# Patient Record
Sex: Female | Born: 1960 | Race: White | Hispanic: No | Marital: Single | Smoking: Never smoker
Health system: Southern US, Community
[De-identification: ages and names within clinical notes are randomized; demographics above are authoritative.]

## PROBLEM LIST (undated history)

## (undated) DIAGNOSIS — Z8659 Personal history of other mental and behavioral disorders: Secondary | ICD-10-CM

## (undated) DIAGNOSIS — Z872 Personal history of diseases of the skin and subcutaneous tissue: Secondary | ICD-10-CM

## (undated) DIAGNOSIS — Z8639 Personal history of other endocrine, nutritional and metabolic disease: Secondary | ICD-10-CM

## (undated) HISTORY — DX: Personal history of other mental and behavioral disorders: Z86.59

## (undated) HISTORY — DX: Personal history of diseases of the skin and subcutaneous tissue: Z87.2

## (undated) HISTORY — PX: BREAST SURGERY: SHX581

## (undated) HISTORY — DX: Personal history of other endocrine, nutritional and metabolic disease: Z86.39

## (undated) HISTORY — PX: WISDOM TOOTH EXTRACTION: SHX21

---

## 1998-11-07 ENCOUNTER — Other Ambulatory Visit: Admission: RE | Admit: 1998-11-07 | Discharge: 1998-11-07 | Payer: Self-pay | Admitting: Obstetrics and Gynecology

## 1999-05-28 ENCOUNTER — Encounter: Admission: RE | Admit: 1999-05-28 | Discharge: 1999-08-26 | Payer: Self-pay | Admitting: Psychology

## 2001-03-02 ENCOUNTER — Encounter: Admission: RE | Admit: 2001-03-02 | Discharge: 2001-05-31 | Payer: Self-pay

## 2001-06-06 ENCOUNTER — Encounter: Admission: RE | Admit: 2001-06-06 | Discharge: 2001-09-04 | Payer: Self-pay

## 2001-09-06 ENCOUNTER — Encounter: Admission: RE | Admit: 2001-09-06 | Discharge: 2001-11-09 | Payer: Self-pay | Admitting: *Deleted

## 2001-10-11 ENCOUNTER — Encounter: Admission: RE | Admit: 2001-10-11 | Discharge: 2002-01-09 | Payer: Self-pay | Admitting: *Deleted

## 2001-10-13 ENCOUNTER — Other Ambulatory Visit: Admission: RE | Admit: 2001-10-13 | Discharge: 2001-10-13 | Payer: Self-pay | Admitting: Family Medicine

## 2001-11-24 ENCOUNTER — Encounter: Admission: RE | Admit: 2001-11-24 | Discharge: 2002-02-22 | Payer: Self-pay | Admitting: *Deleted

## 2002-02-27 ENCOUNTER — Encounter: Admission: RE | Admit: 2002-02-27 | Discharge: 2002-05-28 | Payer: Self-pay | Admitting: *Deleted

## 2002-12-12 ENCOUNTER — Encounter: Admission: RE | Admit: 2002-12-12 | Discharge: 2002-12-12 | Payer: Self-pay | Admitting: Family Medicine

## 2003-01-01 ENCOUNTER — Encounter: Admission: RE | Admit: 2003-01-01 | Discharge: 2003-01-01 | Payer: Self-pay | Admitting: Family Medicine

## 2003-01-15 ENCOUNTER — Encounter: Admission: RE | Admit: 2003-01-15 | Discharge: 2003-01-15 | Payer: Self-pay | Admitting: Family Medicine

## 2003-01-25 ENCOUNTER — Encounter: Admission: RE | Admit: 2003-01-25 | Discharge: 2003-01-25 | Payer: Self-pay | Admitting: Family Medicine

## 2003-02-01 ENCOUNTER — Encounter: Admission: RE | Admit: 2003-02-01 | Discharge: 2003-02-01 | Payer: Self-pay | Admitting: Family Medicine

## 2003-02-09 ENCOUNTER — Encounter: Admission: RE | Admit: 2003-02-09 | Discharge: 2003-02-09 | Payer: Self-pay | Admitting: Family Medicine

## 2003-02-16 ENCOUNTER — Encounter: Admission: RE | Admit: 2003-02-16 | Discharge: 2003-02-16 | Payer: Self-pay | Admitting: Family Medicine

## 2003-03-02 ENCOUNTER — Encounter: Admission: RE | Admit: 2003-03-02 | Discharge: 2003-03-02 | Payer: Self-pay | Admitting: Family Medicine

## 2003-03-05 ENCOUNTER — Other Ambulatory Visit: Admission: RE | Admit: 2003-03-05 | Discharge: 2003-03-05 | Payer: Self-pay | Admitting: Family Medicine

## 2003-03-09 ENCOUNTER — Encounter: Admission: RE | Admit: 2003-03-09 | Discharge: 2003-03-09 | Payer: Self-pay | Admitting: Family Medicine

## 2003-03-20 ENCOUNTER — Encounter: Admission: RE | Admit: 2003-03-20 | Discharge: 2003-03-20 | Payer: Self-pay | Admitting: Family Medicine

## 2003-04-04 ENCOUNTER — Encounter: Admission: RE | Admit: 2003-04-04 | Discharge: 2003-04-04 | Payer: Self-pay | Admitting: Family Medicine

## 2003-04-19 ENCOUNTER — Encounter: Admission: RE | Admit: 2003-04-19 | Discharge: 2003-04-19 | Payer: Self-pay | Admitting: Sports Medicine

## 2003-04-26 ENCOUNTER — Encounter: Admission: RE | Admit: 2003-04-26 | Discharge: 2003-04-26 | Payer: Self-pay | Admitting: Family Medicine

## 2003-06-07 ENCOUNTER — Encounter: Admission: RE | Admit: 2003-06-07 | Discharge: 2003-06-07 | Payer: Self-pay | Admitting: Family Medicine

## 2003-06-26 ENCOUNTER — Encounter: Admission: RE | Admit: 2003-06-26 | Discharge: 2003-06-26 | Payer: Self-pay | Admitting: Family Medicine

## 2003-07-18 ENCOUNTER — Encounter: Admission: RE | Admit: 2003-07-18 | Discharge: 2003-07-18 | Payer: Self-pay | Admitting: Family Medicine

## 2003-08-06 ENCOUNTER — Encounter: Admission: RE | Admit: 2003-08-06 | Discharge: 2003-08-06 | Payer: Self-pay | Admitting: Family Medicine

## 2003-09-20 ENCOUNTER — Encounter: Admission: RE | Admit: 2003-09-20 | Discharge: 2003-09-20 | Payer: Self-pay | Admitting: Sports Medicine

## 2003-10-22 ENCOUNTER — Ambulatory Visit: Payer: Self-pay | Admitting: Family Medicine

## 2003-11-05 ENCOUNTER — Ambulatory Visit: Payer: Self-pay | Admitting: Family Medicine

## 2003-11-19 ENCOUNTER — Ambulatory Visit: Payer: Self-pay | Admitting: Family Medicine

## 2003-12-04 ENCOUNTER — Ambulatory Visit: Payer: Self-pay | Admitting: Family Medicine

## 2003-12-18 ENCOUNTER — Ambulatory Visit: Payer: Self-pay | Admitting: Family Medicine

## 2004-01-03 ENCOUNTER — Ambulatory Visit: Payer: Self-pay | Admitting: Family Medicine

## 2004-02-04 ENCOUNTER — Ambulatory Visit: Payer: Self-pay | Admitting: Family Medicine

## 2004-02-25 ENCOUNTER — Ambulatory Visit: Payer: Self-pay | Admitting: Sports Medicine

## 2004-03-19 ENCOUNTER — Ambulatory Visit: Payer: Self-pay | Admitting: Family Medicine

## 2004-04-07 ENCOUNTER — Ambulatory Visit: Payer: Self-pay | Admitting: Sports Medicine

## 2004-04-30 ENCOUNTER — Ambulatory Visit: Payer: Self-pay | Admitting: Family Medicine

## 2004-06-03 ENCOUNTER — Ambulatory Visit: Payer: Self-pay | Admitting: Family Medicine

## 2004-06-17 ENCOUNTER — Ambulatory Visit: Payer: Self-pay | Admitting: Family Medicine

## 2004-06-24 ENCOUNTER — Ambulatory Visit: Payer: Self-pay | Admitting: Family Medicine

## 2004-07-15 ENCOUNTER — Ambulatory Visit: Payer: Self-pay | Admitting: Family Medicine

## 2004-07-30 ENCOUNTER — Ambulatory Visit: Payer: Self-pay | Admitting: Family Medicine

## 2004-08-05 ENCOUNTER — Other Ambulatory Visit: Admission: RE | Admit: 2004-08-05 | Discharge: 2004-08-05 | Payer: Self-pay | Admitting: Family Medicine

## 2004-08-08 ENCOUNTER — Encounter: Admission: RE | Admit: 2004-08-08 | Discharge: 2004-08-08 | Payer: Self-pay | Admitting: Family Medicine

## 2004-08-19 ENCOUNTER — Ambulatory Visit: Payer: Self-pay | Admitting: Sports Medicine

## 2004-09-03 ENCOUNTER — Ambulatory Visit: Payer: Self-pay | Admitting: Family Medicine

## 2004-09-10 ENCOUNTER — Ambulatory Visit: Payer: Self-pay | Admitting: Family Medicine

## 2004-09-18 ENCOUNTER — Ambulatory Visit: Payer: Self-pay | Admitting: Family Medicine

## 2004-10-30 ENCOUNTER — Ambulatory Visit: Payer: Self-pay | Admitting: Family Medicine

## 2004-11-13 ENCOUNTER — Ambulatory Visit: Payer: Self-pay | Admitting: Family Medicine

## 2004-12-08 ENCOUNTER — Ambulatory Visit: Payer: Self-pay | Admitting: Family Medicine

## 2004-12-22 ENCOUNTER — Ambulatory Visit: Payer: Self-pay | Admitting: Sports Medicine

## 2005-01-01 ENCOUNTER — Ambulatory Visit: Payer: Self-pay | Admitting: Family Medicine

## 2005-01-06 ENCOUNTER — Ambulatory Visit: Payer: Self-pay | Admitting: Family Medicine

## 2005-02-10 ENCOUNTER — Ambulatory Visit: Payer: Self-pay | Admitting: Family Medicine

## 2005-02-24 ENCOUNTER — Ambulatory Visit: Payer: Self-pay | Admitting: Family Medicine

## 2005-03-19 ENCOUNTER — Ambulatory Visit: Payer: Self-pay | Admitting: Family Medicine

## 2005-04-08 ENCOUNTER — Ambulatory Visit: Payer: Self-pay | Admitting: Family Medicine

## 2005-05-28 ENCOUNTER — Ambulatory Visit: Payer: Self-pay | Admitting: Family Medicine

## 2005-06-25 ENCOUNTER — Ambulatory Visit: Payer: Self-pay | Admitting: Family Medicine

## 2005-07-09 ENCOUNTER — Ambulatory Visit: Payer: Self-pay | Admitting: Family Medicine

## 2005-08-04 ENCOUNTER — Ambulatory Visit: Payer: Self-pay | Admitting: Family Medicine

## 2005-08-25 ENCOUNTER — Ambulatory Visit: Payer: Self-pay | Admitting: Family Medicine

## 2005-10-05 ENCOUNTER — Ambulatory Visit: Payer: Self-pay | Admitting: Family Medicine

## 2005-10-19 ENCOUNTER — Ambulatory Visit: Payer: Self-pay | Admitting: Family Medicine

## 2005-11-10 ENCOUNTER — Ambulatory Visit: Payer: Self-pay | Admitting: Family Medicine

## 2005-12-03 ENCOUNTER — Ambulatory Visit: Payer: Self-pay | Admitting: Sports Medicine

## 2006-10-08 ENCOUNTER — Telehealth: Payer: Self-pay | Admitting: Family Medicine

## 2008-02-20 ENCOUNTER — Emergency Department (HOSPITAL_COMMUNITY): Admission: EM | Admit: 2008-02-20 | Discharge: 2008-02-20 | Payer: Self-pay | Admitting: Emergency Medicine

## 2008-09-25 ENCOUNTER — Ambulatory Visit (HOSPITAL_BASED_OUTPATIENT_CLINIC_OR_DEPARTMENT_OTHER): Admission: RE | Admit: 2008-09-25 | Discharge: 2008-09-25 | Payer: Self-pay | Admitting: Orthopedic Surgery

## 2010-05-03 LAB — ANAEROBIC CULTURE: Gram Stain: NONE SEEN

## 2010-05-03 LAB — CULTURE, ROUTINE-ABSCESS: Gram Stain: NONE SEEN

## 2010-05-12 LAB — COMPREHENSIVE METABOLIC PANEL
ALT: 27 U/L (ref 0–35)
AST: 30 U/L (ref 0–37)
Albumin: 3.6 g/dL (ref 3.5–5.2)
Alkaline Phosphatase: 57 U/L (ref 39–117)
BUN: 15 mg/dL (ref 6–23)
CO2: 35 mEq/L — ABNORMAL HIGH (ref 19–32)
Calcium: 9 mg/dL (ref 8.4–10.5)
Chloride: 97 mEq/L (ref 96–112)
Creatinine, Ser: 0.82 mg/dL (ref 0.4–1.2)
GFR calc Af Amer: >60 mL/min (ref >60)
GFR calc non Af Amer: >60 mL/min (ref >60)
Glucose, Bld: 106 mg/dL — ABNORMAL HIGH (ref 70–99)
Potassium: 2.8 mEq/L — ABNORMAL LOW (ref 3.5–5.1)
Sodium: 138 mEq/L (ref 135–145)
Total Bilirubin: 0.8 mg/dL (ref 0.3–1.2)
Total Protein: 5.8 g/dL — ABNORMAL LOW (ref 6.0–8.3)

## 2010-05-12 LAB — URINE MICROSCOPIC-ADD ON

## 2010-05-12 LAB — URINALYSIS, ROUTINE W REFLEX MICROSCOPIC
Bilirubin Urine: NEGATIVE
Glucose, UA: NEGATIVE mg/dL
Hgb urine dipstick: NEGATIVE
Leukocytes, UA: NEGATIVE
Nitrite: NEGATIVE
Protein, ur: 30 mg/dL — AB
Specific Gravity, Urine: 1.025 (ref 1.005–1.030)
Urobilinogen, UA: 0.2 mg/dL (ref 0.0–1.0)
pH: 8 (ref 5.0–8.0)

## 2010-05-12 LAB — CBC
HCT: 39.4 % (ref 36.0–46.0)
Hemoglobin: 13.2 g/dL (ref 12.0–15.0)
MCHC: 33.5 g/dL (ref 30.0–36.0)
MCV: 88.3 fL (ref 78.0–100.0)
Platelets: 176 10*3/uL (ref 150–400)
RBC: 4.46 MIL/uL (ref 3.87–5.11)
RDW: 13.2 % (ref 11.5–15.5)
WBC: 3.6 10*3/uL — ABNORMAL LOW (ref 4.0–10.5)

## 2010-05-12 LAB — DIFFERENTIAL
Basophils Absolute: 0 10*3/uL (ref 0.0–0.1)
Basophils Relative: 1 % (ref 0–1)
Eosinophils Absolute: 0.1 10*3/uL (ref 0.0–0.7)
Eosinophils Relative: 2 % (ref 0–5)
Lymphocytes Relative: 30 % (ref 12–46)
Lymphs Abs: 1.1 10*3/uL (ref 0.7–4.0)
Monocytes Absolute: 0.4 10*3/uL (ref 0.1–1.0)
Monocytes Relative: 10 % (ref 3–12)
Neutro Abs: 2.1 10*3/uL (ref 1.7–7.7)
Neutrophils Relative %: 57 % (ref 43–77)

## 2010-05-12 LAB — GLUCOSE, CAPILLARY: Glucose-Capillary: 98 mg/dL (ref 70–99)

## 2010-05-21 ENCOUNTER — Other Ambulatory Visit: Payer: Self-pay | Admitting: Family Medicine

## 2010-05-21 ENCOUNTER — Other Ambulatory Visit (HOSPITAL_COMMUNITY)
Admission: RE | Admit: 2010-05-21 | Discharge: 2010-05-21 | Disposition: A | Discharge status: Home / Self Care | Payer: BC Managed Care – PPO | Source: Ambulatory Visit | Attending: Family Medicine | Admitting: Family Medicine

## 2010-05-21 DIAGNOSIS — Z124 Encounter for screening for malignant neoplasm of cervix: Secondary | ICD-10-CM | POA: Insufficient documentation

## 2010-06-10 NOTE — Consult Note (Signed)
NAMEKENNY, Ali                ACCOUNT NO.:  [id number]   MEDICAL RECORD NO.:  [id number]          PATIENT TYPE:  AMB   LOCATION:  DSC                          FACILITY:  [facility]   PHYSICIAN:  Katy Fitch. Sypher, M.D. DATE OF BIRTH:  08/21/1960   DATE OF CONSULTATION:  09/25/2008  DATE OF DISCHARGE:                                 CONSULTATION   REFERRING PHYSICIAN:  Hope M. Danella Deis, MD, dermatologist.   CHIEF COMPLAINT:  Severely swollen, discolored, and painful right ring  finger.   HISTORY OF PRESENT ILLNESS:  [name] is a 50 year old right-hand  dominant woman, who works part-time at the [employer].  She is  a very active athlete, enjoying swimming, another endeavors.  She has a  history of fungal onychomycosis affecting her toes and presumably her  fingers.   She has had a several-week syndrome of swelling and rubor of her right  ring finger followed by acute pain during the past 4 days.  She was seen  by Dr. Danella Deis for Dermatology consult earlier today, and was noted to  have profound swelling of her right ring finger nail fold with drainage  from beneath the nail.  Dr. Danella Deis made the diagnosis of a probable  stage III paronychia and recommended surgical incision and drainage.   Ms. Mudrick was advised to come urgently to the [facility] for  minor operating room procedure.  On examination, she was noted to have a  very discolored, violaceous-appearing nail fold with 3+ swelling of her  pulp of the right ring finger and marked swelling of the radial nail  wall.   This had the appearance of superinfected herpetic whitlow or possible  fungal onychomycosis with bacterial superinfection.   We recommended a proper drainage and culture, followed by therapy for  both bacterial and viral etiologies.   After informed consent, Ms. Colegrove was admitted at this time  anticipating minor operating room procedure.   Her past history is reviewed in detail.   She reported no drug or food  allergies.  She is on no routine medications.  She had been provided  prescriptions by Dr. Danella Deis including analgesic medications in the form  of Vicodin 5 mg, Diflucan for antifungal effect, and Septra DS one p.o.  b.i.d. as antibacterial antibiotic.   Her past history was detailed and otherwise noncontributory.   PHYSICAL EXAMINATION:  VITAL SIGNS:  Temperature 98.1, pulse 85 and  regular, respirations 18, blood pressure 103/75.  GENERAL:  She is awake and alert.  HEENT:  All within normal limits.  CHEST:  All within normal limits.  ABDOMEN:  All within normal limits.  NEUROLOGIC:  All within normal limits.  EXTREMITIES:  Careful inspection of her hand revealed a very swollen  right ring finger nail fold.  She had violaceous discoloration and  swelling of the pulp consistent with a possible viral whitlow versus a  chronic fungal onychomycosis with superinfection.   We advised Ms. Wellen to proceed with drainage of her nail fold by  partial removal of the nail plate and appropriate cultures.  We did discuss possible viral cultures, however, given her prolonged  course and the possibility of bacterial superinfection at this time, we  relied more on the clinical presentation of the finger and deferred  viral cultures at this time.   She was advised to proceed with simple decompression cultures and  combined viral suppression therapy in the form of acyclovir 400 mg p.o.  t.i.d. x5 days, and continuing Dr. Sherrlyn Hock DS 1 p.o. b.i.d. as  an anti-bacterial antibiotic.   Questions regarding the anticipated procedure invited and answered.      Katy Fitch Sypher, M.D.  Electronically Signed     RVS/MEDQ  D:  09/25/2008  T:  09/26/2008  Job:  [id number]   cc:   Hope M. Danella Deis, M.D.

## 2010-06-10 NOTE — Op Note (Signed)
[name], [name]                ACCOUNT NO.:  [id number]   MEDICAL RECORD NO.:  [id number]          PATIENT TYPE:  AMB   LOCATION:  DSC                          FACILITY:  [facility]   PHYSICIAN:  Valerie Fitch. Ali, M.D. DATE OF BIRTH:  October 02, 1960   DATE OF PROCEDURE:  09/25/2008  DATE OF DISCHARGE:                               OPERATIVE REPORT   PREOPERATIVE DIAGNOSES:  Possible stage III paronychia right ring finger  nail fold versus infected/superinfected herpetic whitlow.   POSTOPERATIVE DIAGNOSES:  Possible stage III paronychia right ring  finger nail fold versus infected/superinfected herpetic whitlow.   OPERATION:  Removal of radial nail plate, right ring finger followed by  drainage of dorsal and radial nail fold and drainage of pulp with  obtaining aerobic and anaerobic cultures and Gram-stain specimen  followed by dressing wound open with Xeroflo sterile gauze and Coban.   OPERATIONS:  Valerie Fitch. Sypher, MD   ASSISTANT:  Valerie Reeks Dasnoit, PA-C   ANESTHESIA:  Lidocaine 2% metacarpal head level block, right ring  finger.  This was performed in a minor operating room setting under  straight local anesthesia without sedation.   INDICATIONS:  [name] is a 50 year old right-hand dominant woman  referred through the courtesy of Dr. Campbell Ali for evaluation of what  was thought to be an acute stage III paronychia.   [name] had had swelling of the finger for nearly 2 weeks and during the  past 4 days aggressive pain, followed by purplish swelling of the nail  fold.  She saw Dr. Danella Ali for evaluation of possible nail fungus and was  noted to have what was thought to be an acute stage III paronychia.  She  was referred for an urgent hand surgery consult.   Clinical examination revealed violaceous rubor of the nail fold and  generalized swelling of the pulp.  There was no sign of proximal  lymphangitis or lymphadenopathy.   This had the appearance of a possible chronic  fungal nail infection  superinfected with bacteria or possibly a chronic herpetic whitlow  superinfected.   Due to the acute nature of the predicament, we recommended proceeding  with incision and drainage at this time and appropriate cultures.   Careful questioning of [name] revealed that she had had other  episodes of nail fold rubor and swelling that lasted up to 2 weeks and  with detailed questioning she could not specifically recall if it was  the right ring finger in the past or other digits.   She has onycholysis of her toenails.  I questioned whether or not anyone  had ever considered that she might have nail plate psoriasis.   Due to the acute nature of predicament, she is brought to the operating  room at this time for appropriate cultures and drainage.   PROCEDURE:  [name] is brought to the operating room and placed  in supine position upon operating table.   Following the placement of a 2% lidocaine metacarpal head level block,  excellent anesthesia of the right ring finger was achieved.  The right  arm was then prepped with Betadine soap and solution, sterilely draped.  The right ring finger was exsanguinated with a gauze wrap and a 1/2-inch  Penrose drain placed over the proximal phalangeal segment as digital  tourniquet.   The procedure commenced with undermining of the nail plate with a Market researcher followed by resection of a 4-mm wide strip of the radial nail  plate deep to the dorsal nail fold.   Serosanguineous fluid was recovered.  This was cultured for aerobic and  anaerobic growth and a microrongeur was used to remove inflammatory  granulation tissue to send for a Gram-stain.   The pulp was then drained by use of tenotomy scissors to separate  between the skin from the distal phalanx by release of the cutaneous  ligaments and a Therapist, nutritional was used to decompress the pulp deep to  the neurovascular bundle.  Once again serosanguineous  fluid was  recovered rather than frank purulence.   After carefully exploring this finger it was my impression that more  likely than not this was either chronic fungal onychomycosis  superinfected with bacteria or possibly a herpetic whitlow that was  superinfected.   We have advised [name] that we will dress the wound open and  recheck her in 3 days or sooner for followup.   She was provided prescription for Septra DS one p.o. b.i.d. by Dr.  Danella Ali.  We agreed that this is a very appropriate therapy to treat gram-  positive infection and some Gram negatives.  In addition, I have added  acyclovir 400 mg p.o. t.i.d. x5 days on the chance that this is herpetic  whitlow.   [name] is quite small, weighing less than 50 kg.   In addition, she was provided Vicodin 5 mg by Dr. Danella Ali and Diflucan  due to her history of fungal issues.   We will see her back for followup on August 28, 2008, at our office for  wound check.  We will examine cultures at this time.      [name], M.D.  Electronically Signed     RVS/MEDQ  D:  09/25/2008  T:  09/26/2008  Job:  [id number]   cc:   Hope M. [name], M.D.

## 2011-05-27 ENCOUNTER — Other Ambulatory Visit: Payer: Self-pay | Admitting: Dermatology

## 2011-08-06 ENCOUNTER — Ambulatory Visit (INDEPENDENT_AMBULATORY_CARE_PROVIDER_SITE_OTHER): Payer: BC Managed Care – PPO | Admitting: Sports Medicine

## 2011-08-06 VITALS — BP 90/60 | Ht 64.0 in | Wt 100.0 lb

## 2011-08-06 DIAGNOSIS — M216X9 Other acquired deformities of unspecified foot: Secondary | ICD-10-CM

## 2011-08-06 DIAGNOSIS — M722 Plantar fascial fibromatosis: Secondary | ICD-10-CM

## 2011-08-06 DIAGNOSIS — M79673 Pain in unspecified foot: Secondary | ICD-10-CM

## 2011-08-06 DIAGNOSIS — Q667 Congenital pes cavus, unspecified foot: Secondary | ICD-10-CM

## 2011-08-06 DIAGNOSIS — M79609 Pain in unspecified limb: Secondary | ICD-10-CM

## 2011-08-06 NOTE — Progress Notes (Signed)
  Subjective:    Patient ID: [name], female    DOB: 1961-01-26, 50 y.o.   MRN: [id number]  HPI Patient is a pleasant 51 year old female that comes in today with 3-4 weeks of left heel pain. No trauma that she can recall but gradual onset of pain is on the plantar aspect of her heel. Worse with running as well as with first step in the morning. She runs about 14 miles a week and also participates in [employer] as well as step aerobics. She denies pain elsewhere in her foot or ankle. No pain at rest. She's been taking over-the-counter ibuprofen which has been helpful. She's also tried several different kinds of off-the-shelf orthotics. She most recently saw Dr. Corliss Skains who ordered an x-ray of her heel. It is available for review. She denies any numbness or tingling. No similar problems in the past.   Review of Systems     Objective:   Physical Exam  Well-developed thin 51 year old female. No acute distress. Awake alert and oriented x3. Examination of the left heel shows tenderness to palpation at the calcaneal insertion of the plantar fascia. Negative calcaneal squeeze. She has a rigid pes cavus foot. No soft tissue swelling no erythema. No tenderness to palpation along the Achilles tendon. Evaluation of her running form shows overall good form for with a slight limp due to to her heel pain. Good dorsalis pedis and posterior tibial pulses. Sensation is intact to light touch grossly.   X-rays of her left heel including AP and lateral views are reviewed from an outside source. X-rays are dated 08/04/2011. Shows a small calcaneal heel at the plantar fascial insertion point. No evidence of stress fracture. No significant soft tissue swelling.  Ultrasound evaluation: Ultrasound evaluation of her left heel shows a slightly thickened plantar fascia when compared to the uninvolved right heel. There is some hypo-echogenicity suggestive of edema in the proximal plantar fascia. Ultrasound evidence of  calcaneal spurring as seen on x-ray.      Assessment & Plan:  #1. Left heel pain secondary to plantar fasciitis #2. Pes cavus foot  Home exercise program consisting of plantar fascial stretches and decentered strengthening exercises. Daily ice soaks for the left heel. Arch strap. She has a nice off-the-shelf orthotic which has a good condition heel and I've asked that she wear these in her shoes. Continue with over-the-counter anti-inflammatories when necessary and followup in 3-4 weeks for recheck. In the meantime she can continue with her activities including running as long as she is not limping. Call questions or concerns prior followup visit.

## 2011-08-27 ENCOUNTER — Ambulatory Visit: Payer: BC Managed Care – PPO | Admitting: Sports Medicine

## 2011-09-07 ENCOUNTER — Encounter: Payer: Self-pay | Admitting: Sports Medicine

## 2011-09-07 ENCOUNTER — Ambulatory Visit (INDEPENDENT_AMBULATORY_CARE_PROVIDER_SITE_OTHER): Payer: BC Managed Care – PPO | Admitting: Sports Medicine

## 2011-09-07 VITALS — BP 104/70 | HR 80 | Ht 64.0 in | Wt 100.0 lb

## 2011-09-07 DIAGNOSIS — M722 Plantar fascial fibromatosis: Secondary | ICD-10-CM

## 2011-09-07 NOTE — Progress Notes (Signed)
  Subjective:    Patient ID: [name], female    DOB: Nov 13, 1960, 51 y.o.   MRN: [id number]  HPI Estephanie is here for followup on her left heel plantar fasciitis. Overall she feels that she is about 50% better. She has been running as well as participating in high impact aerobics. Only using anti-inflammatories on an occasional basis. She's been using her well cushioned inserts. She admits that she has not been very compliant with her home exercise program.    Review of Systems     Objective:   Physical Exam Left heel shows  minimal tenderness to palpation at the calcaneal insertion of the plantar fascia. Negative calcaneal squeeze. No soft tissue swelling. Neurovascular intact distally. Walking without a limp.  MSK ultrasound of the left heel still shows a little bit of edema at the insertion of the plantar fascia but overall thickness has decreased to 18 mm.       Assessment & Plan:  1. Improving left heel pain secondary to plantar fasciitis  Patient will continue with her current treatment. I would like for her to be more diligent about doing her home exercises. Continue to increase activity as tolerated and followup when necessary.

## 2013-09-04 ENCOUNTER — Other Ambulatory Visit (HOSPITAL_COMMUNITY)
Admission: RE | Admit: 2013-09-04 | Discharge: 2013-09-04 | Disposition: A | Discharge status: Home / Self Care | Payer: BC Managed Care – PPO | Source: Ambulatory Visit | Attending: Family Medicine | Admitting: Family Medicine

## 2013-09-04 ENCOUNTER — Other Ambulatory Visit: Payer: Self-pay | Admitting: Family Medicine

## 2013-09-04 DIAGNOSIS — Z124 Encounter for screening for malignant neoplasm of cervix: Secondary | ICD-10-CM | POA: Insufficient documentation

## 2013-09-06 LAB — CYTOLOGY - PAP

## 2014-12-26 ENCOUNTER — Encounter: Payer: Self-pay | Admitting: Family Medicine

## 2014-12-26 ENCOUNTER — Ambulatory Visit (INDEPENDENT_AMBULATORY_CARE_PROVIDER_SITE_OTHER): Payer: 59 | Admitting: Family Medicine

## 2014-12-26 VITALS — BP 100/66 | Ht 64.0 in | Wt 105.0 lb

## 2014-12-26 DIAGNOSIS — M79671 Pain in right foot: Secondary | ICD-10-CM

## 2014-12-26 DIAGNOSIS — M722 Plantar fascial fibromatosis: Secondary | ICD-10-CM | POA: Insufficient documentation

## 2014-12-26 NOTE — Progress Notes (Signed)
  Valerie ParrMary W Ali - 54 y.o. female MRN [id number]  Date of birth: 1960-02-28 Valerie ParrMary W Ali is a 54 y.o. female who presents today for right heel pain.  Right heel pain, initial visit-patient with previous history of calcaneal heel spur and plantar fasciitis several years ago. She has now been having worsening right heel pain for the past 1 month. It is now point where she is limping. Pain localized in the posterior medial aspect of the calcaneus. Worse with barefoot walking or 4 steps in the morning. Pain improved with medial longitudinal arch support and tennis shoes. She has not tried stretches or ice bath at this time.  PMHx - Updated and reviewed.  Contributory factors include: Noncontributory PSHx - Updated and reviewed.  Contributory factors include:  Noncontributory FHx - Updated and reviewed.  Contributory factors include:  Noncontributory Medications - Vyvanse   ROS Per HPI.  12 point negative other than per HPI.   Exam:  Filed Vitals:   12/26/14 1524  BP: 100/66   Gen: NAD, AAO 3 Cardiorespiratory - Normal respiratory effort/rate.  RRR Skin: No rashes or erythema Extremities: No edema, pulses +2 bilateral upper and lower extremity Ankle: No visible erythema or swelling. Range of motion is full in all directions. Strength is 5/5 in all directions. Stable lateral and medial ligaments; squeeze test and kleiger test unremarkable; Talar dome nontender; No pain at base of 5th MT; No tenderness over cuboid; No tenderness over N spot or navicular prominence No tenderness on posterior aspects of lateral and medial malleolus No sign of peroneal tendon subluxations; Negative tarsal tunnel tinel's Able to walk 4 steps. R PF - TTP at medial process of calcaneal tuberosity.  Worse with dorsiflexion of great toe

## 2014-12-26 NOTE — Assessment & Plan Note (Signed)
Right foot. Ultrasound showing 0.49 cm sagittal thickness. Small hypoechoic/anechoic tear at the deep portion on the medial process of the calcaneal tuberosity. -Recommend arch straps, stretches, tennis shoes and medial longitudinal arch support. As well ice bath's and avoiding aggravating activities at this time. -Follow-up in 4-6 weeks, consider injection if still having severe pain, as well as possible Cam Walker boot. -Eventually she'll need orthotics which was discussed in today's visit.

## 2015-01-04 ENCOUNTER — Encounter: Payer: Self-pay | Admitting: Sports Medicine

## 2015-01-04 ENCOUNTER — Ambulatory Visit: Payer: Self-pay | Admitting: Sports Medicine

## 2015-01-04 ENCOUNTER — Ambulatory Visit
Admission: RE | Admit: 2015-01-04 | Discharge: 2015-01-04 | Disposition: A | Discharge status: Home / Self Care | Payer: 59 | Source: Ambulatory Visit | Attending: Sports Medicine | Admitting: Sports Medicine

## 2015-01-04 ENCOUNTER — Other Ambulatory Visit: Payer: Self-pay | Admitting: Sports Medicine

## 2015-01-04 ENCOUNTER — Ambulatory Visit (INDEPENDENT_AMBULATORY_CARE_PROVIDER_SITE_OTHER): Payer: 59 | Admitting: Sports Medicine

## 2015-01-04 VITALS — BP 110/72 | Ht 64.0 in | Wt 105.0 lb

## 2015-01-04 DIAGNOSIS — M79671 Pain in right foot: Secondary | ICD-10-CM

## 2015-01-04 MED ORDER — DICLOFENAC SODIUM 75 MG PO TBEC: DELAYED_RELEASE_TABLET | ORAL | Status: DC

## 2015-01-04 NOTE — Progress Notes (Signed)
   Subjective:    Patient ID: Valerie ParrMary W Deeley, female    DOB: 07-22-1960, 54 y.o.   MRN: [id number]  HPI   Patient comes in today with worsening right heel pain. She was last seen in the office on November 30 by Dr. Paulina FusiHess. Ultrasound at that time showed a thickened plantar fascia and she was diagnosed with plantar fasciitis. She was given an arch strap and told to wear tennis shoes with good arch support. She has been wearing a three quarters length orthotic which has been uncomfortable. She says that over the past 2 weeks her pain has intensified. It is no longer present only with first step in the morning. It is present throughout the day. She is having to limp. No swelling. Her pain remains primarily in the heel. No pain at rest. She was diagnosed with plantar fasciitis in 2013 and states that her current symptoms are different in nature than what she experienced at that time. She has been able to continue with activity such as biking and swimming but has not been able to run.    Review of Systems     Objective:   Physical Exam Well-developed, well-nourished. No acute distress  Right heel: Mild tenderness to palpation at the calcaneal origin of the plantar fascia. Positive calcaneal squeeze. Negative Tinel's. There is a palpable large osteophyte over the dorsum of the midfoot. Nontender to palpation. Neurovascularly intact distally. Walking with a limp.  X-rays of the right heel show no obvious stress fracture. X-rays of the right foot show midfoot degenerative changes.       Assessment & Plan:  Persistent right heel pain secondary to plantar fasciitis versus calcaneal stress fracture  I discussed the possibility of MRI with the patient to rule out an occult calcaneal stress fracture but the patient is worried about finances. We also discussed a Cam Walker and crutches but again she is afraid the cost will be too much. Therefore, we will try some simple sports insoles and a second arch  strap. I will put her on Voltaren 75 mg twice daily with food for 7 days. She is cautioned about GI upset with this medicine. Phone follow-up with me in one week for a check on her progress. She will check with her insurance regarding cost of the MRI. If it is too expensive then I may schedule a follow-up appointment for an ultrasound guided diagnostic/therapeutic cortisone injection into her right plantar fascia. She is okay to continue with biking and swimming but no running until further notice.

## 2015-01-16 ENCOUNTER — Ambulatory Visit: Payer: 59 | Admitting: Family Medicine

## 2016-07-30 IMAGING — CR DG FOOT COMPLETE 3+V*R*
3 series · 3 of 3 positions shown · non-contrast
Comparison: None.

CLINICAL DATA: [DATE] week history of midfoot and hindfoot pain. No
specific injury.

EXAM:
RIGHT OS CALCIS - 2+ VIEW; RIGHT FOOT COMPLETE - 3+ VIEW

[t foot ap right (1 of 2)]
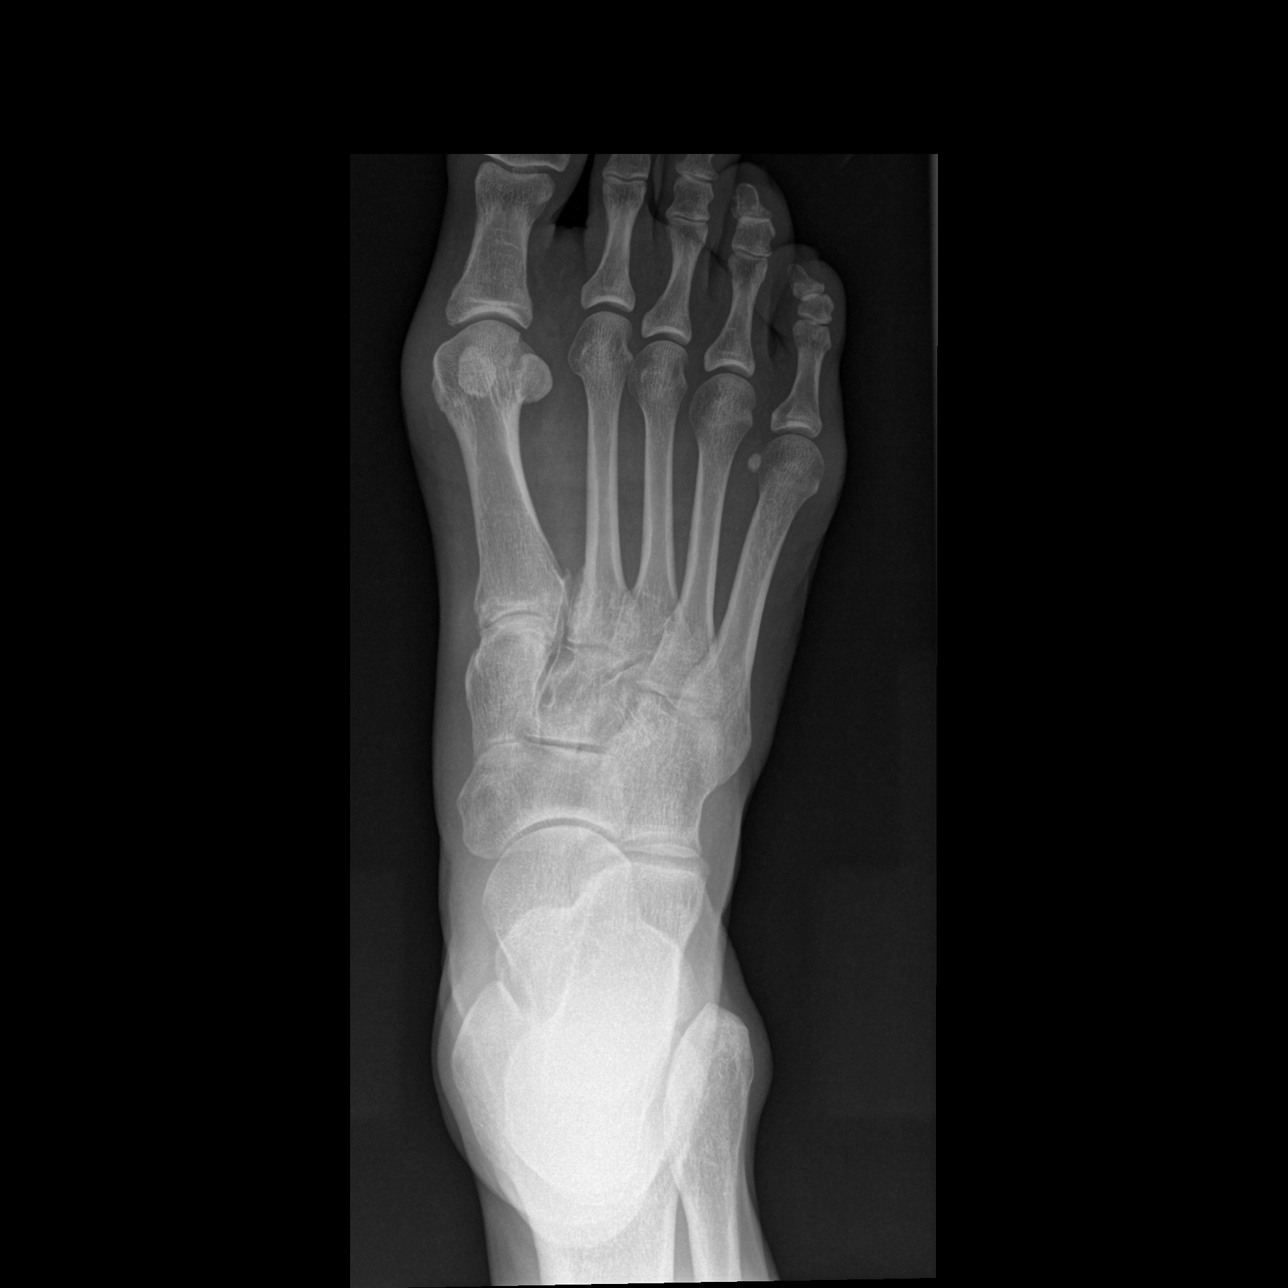

[t foot ap right (2 of 2)]
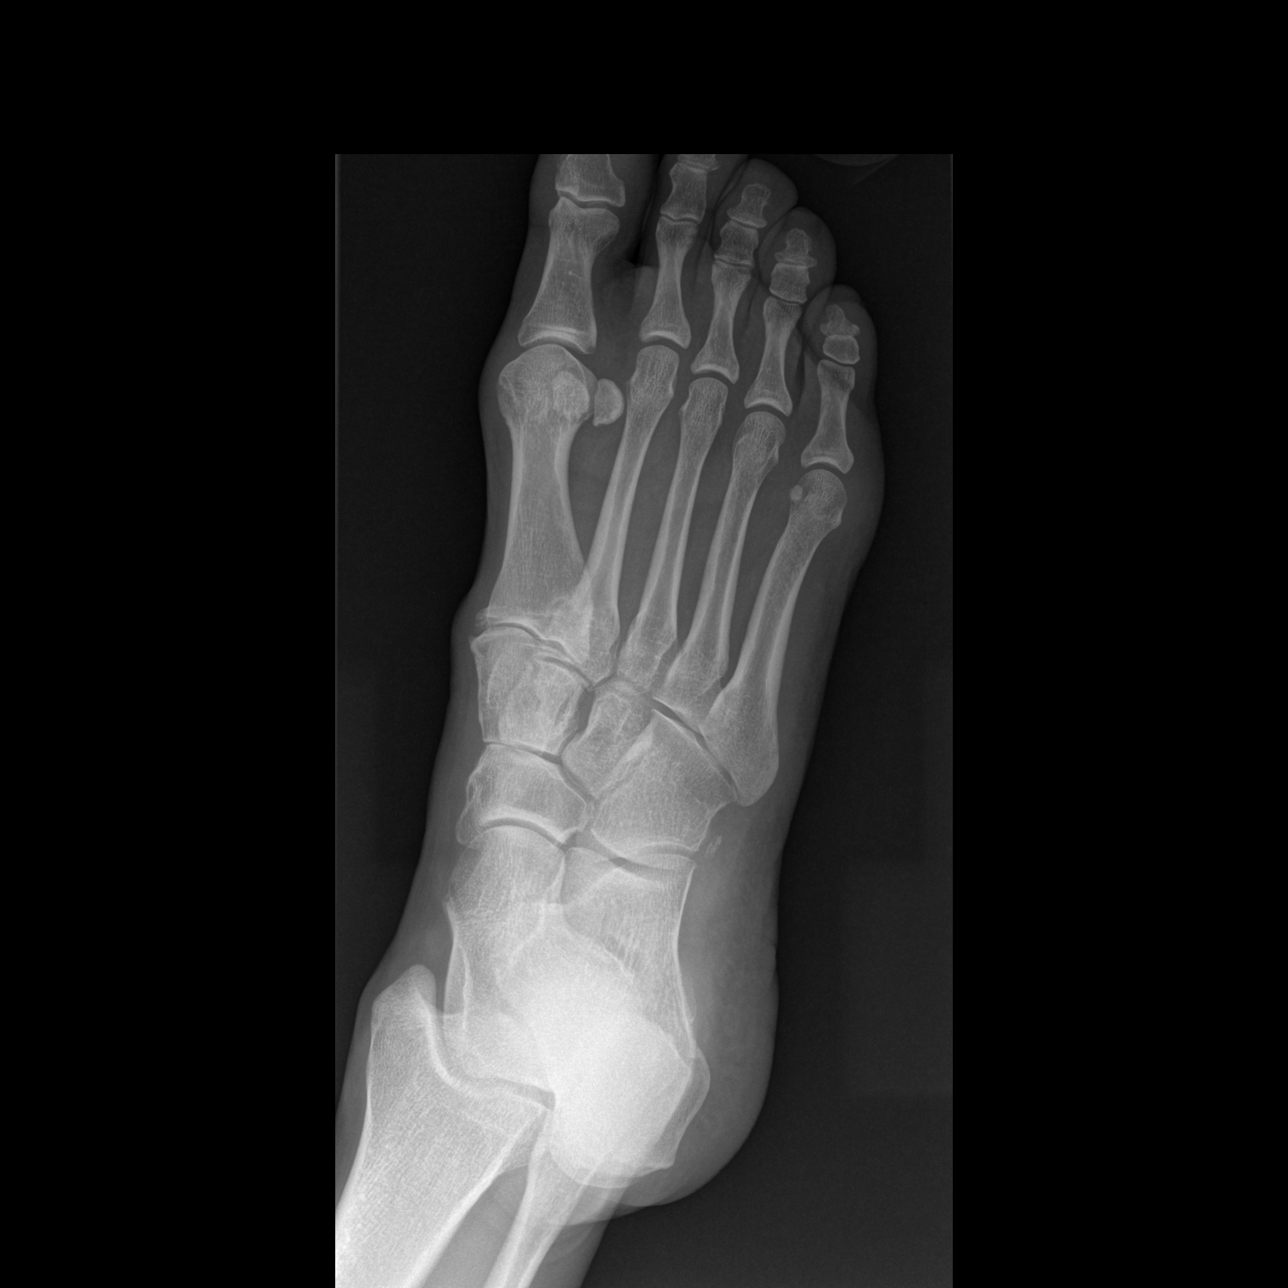

[t foot lat right]
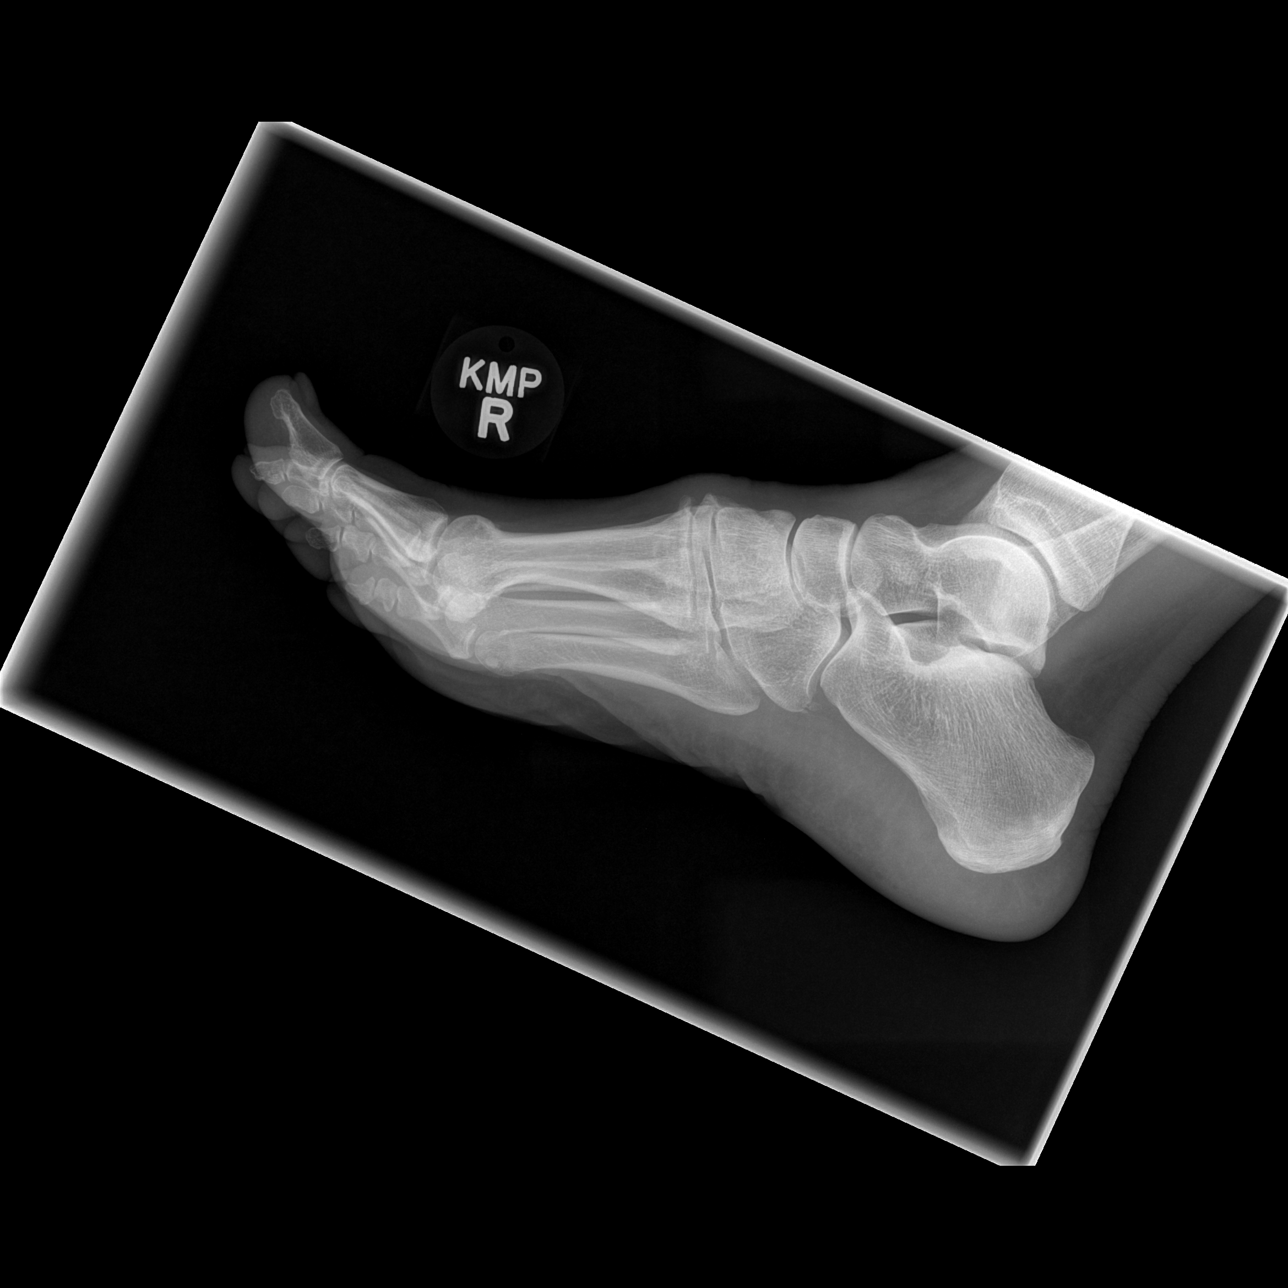

[3 of 3 positions shown; findings below may reference images not displayed]

FINDINGS: There are moderate midfoot degenerative changes with joint space
narrowing and osteophytic spurring. No acute bony findings or
erosive findings. No calcaneal stress fracture or calcaneal heel
spur. The ankle joint appears normal.
IMPRESSION: Moderate midfoot degenerative changes but no acute bony findings.

## 2017-06-22 ENCOUNTER — Ambulatory Visit (INDEPENDENT_AMBULATORY_CARE_PROVIDER_SITE_OTHER): Payer: 59 | Admitting: Sports Medicine

## 2017-06-22 VITALS — BP 104/68 | Ht 63.5 in | Wt 102.0 lb

## 2017-06-22 DIAGNOSIS — M19071 Primary osteoarthritis, right ankle and foot: Secondary | ICD-10-CM

## 2017-06-22 DIAGNOSIS — M79671 Pain in right foot: Secondary | ICD-10-CM | POA: Diagnosis not present

## 2017-06-23 ENCOUNTER — Encounter: Payer: Self-pay | Admitting: Sports Medicine

## 2017-06-23 NOTE — Progress Notes (Signed)
   Subjective:    Patient ID: [name], female    DOB: 26-Feb-1960, 57 y.o.   MRN: [id number]  HPI chief complaint: Right foot pain  57 year old female comes in today complaining of long-standing right foot pain. She localizes most of her pain to the dorsum of her foot. She has noticed swelling in this area as well. She was last seen in the office in 2016 and diagnosed with plantar fasciitis. We gave her some green sports insoles and scaphoid pads which were helpful. We had discussed custom orthotics but she decided to wait as they were going to be expensive. The pain in her foot is present primarily with activity. She does endorse some intermittent numbness and tingling in the fourth and fifth toes but also has a history of sciatica. Previous x-rays of her right foot showed moderate midfoot DJD with spurring on the dorsum of the foot. Patient denies any recent trauma. She prefers to wear primarily open toed shoes.  Interim medical history reviewed Medications reviewed Allergies reviewed    Review of Systems As above    Objective:   Physical Exam Well-developed, well-nourished. No acute distress. Awake alert and oriented 3. Vital signs reviewed  Right foot: Cavus foot. Significant bony deformity on the dorsum of the foot consistent with probable large spur formation in the mid foot. Mild soft tissue swelling. No palpable ganglion cyst. Full ankle range of motion. Neurovascularly intact distally. Walking without a limp.    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 We tried placing some scaphoid pads in her shoes but she found them uncomfortable. I also discussed getting her some new green sports insoles and scaphoid pads but these will not fit in her open toed shoes. I want her to get an updated x-ray of her foot so that I can  reevaluate her midfoot DJD and make sure that the bony deformity seen on physical exam is in fact from significant bone spur formation. Phone follow-up with those x-ray results once available.

## 2017-07-28 ENCOUNTER — Ambulatory Visit
Admission: RE | Admit: 2017-07-28 | Discharge: 2017-07-28 | Disposition: A | Discharge status: Home / Self Care | Payer: 59 | Source: Ambulatory Visit | Attending: Sports Medicine | Admitting: Sports Medicine

## 2017-07-28 DIAGNOSIS — M79671 Pain in right foot: Secondary | ICD-10-CM

## 2019-02-21 IMAGING — CR DG FOOT COMPLETE 3+V*R*
3 series · 3 of 3 positions shown · non-contrast
Comparison: None.

CLINICAL DATA: Chronic RIGHT foot pain for 1 year. History of
plantar fasciitis.

EXAM:
RIGHT FOOT COMPLETE - 3+ VIEW

[x foot ap right]
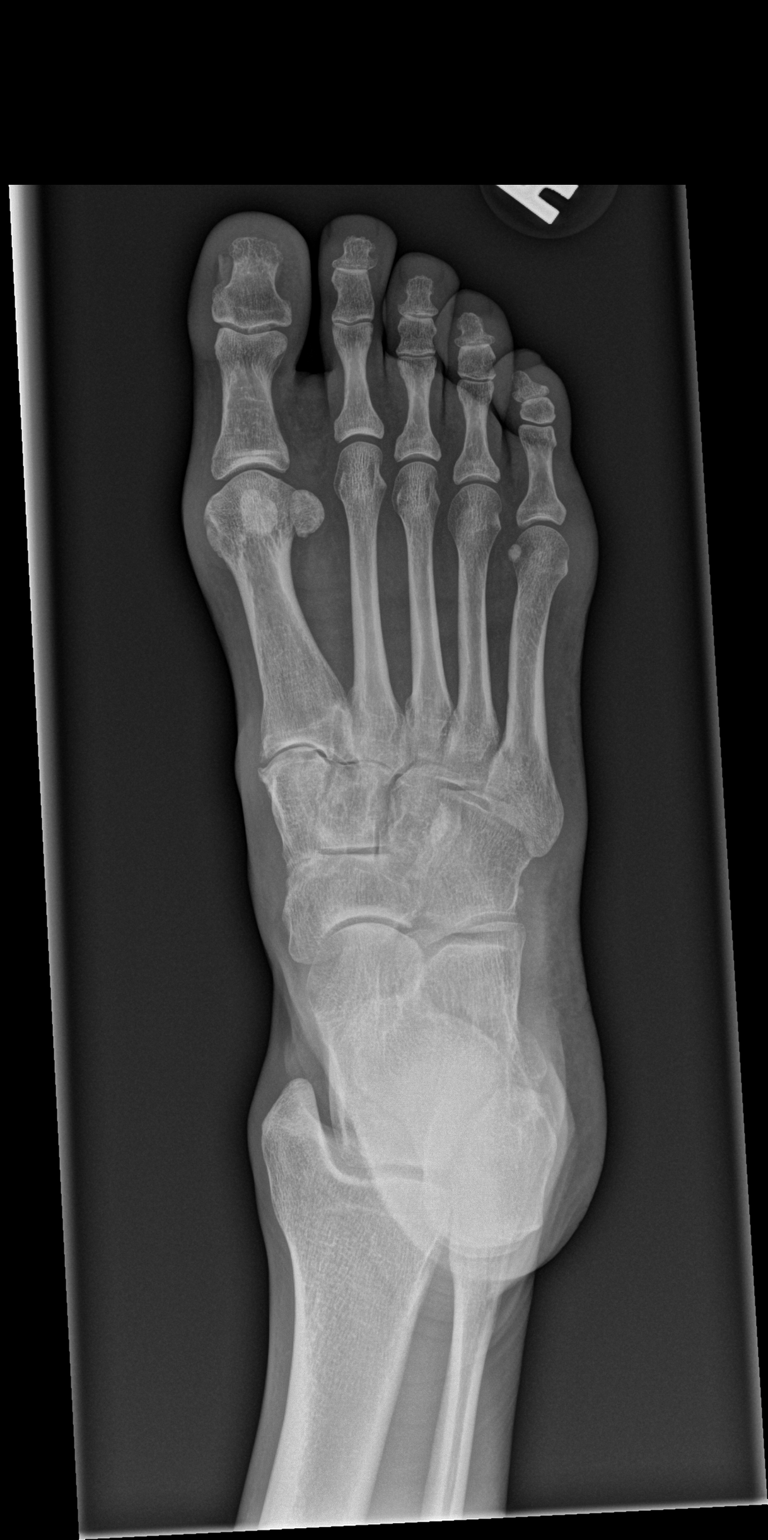

[x foot obl right]
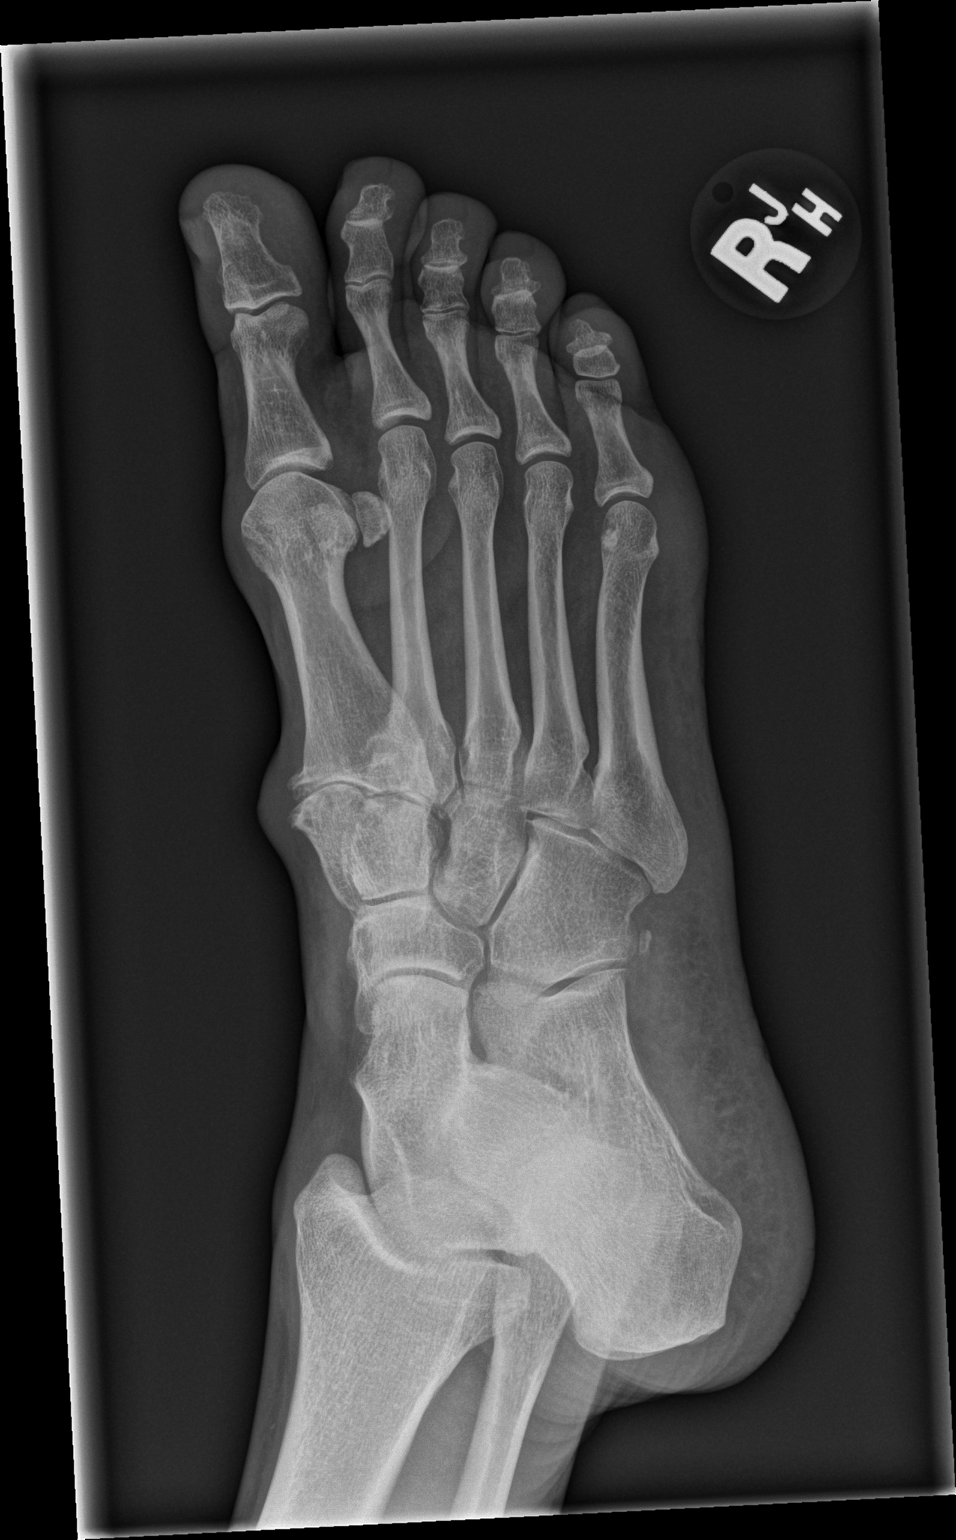

[x foot lat right]
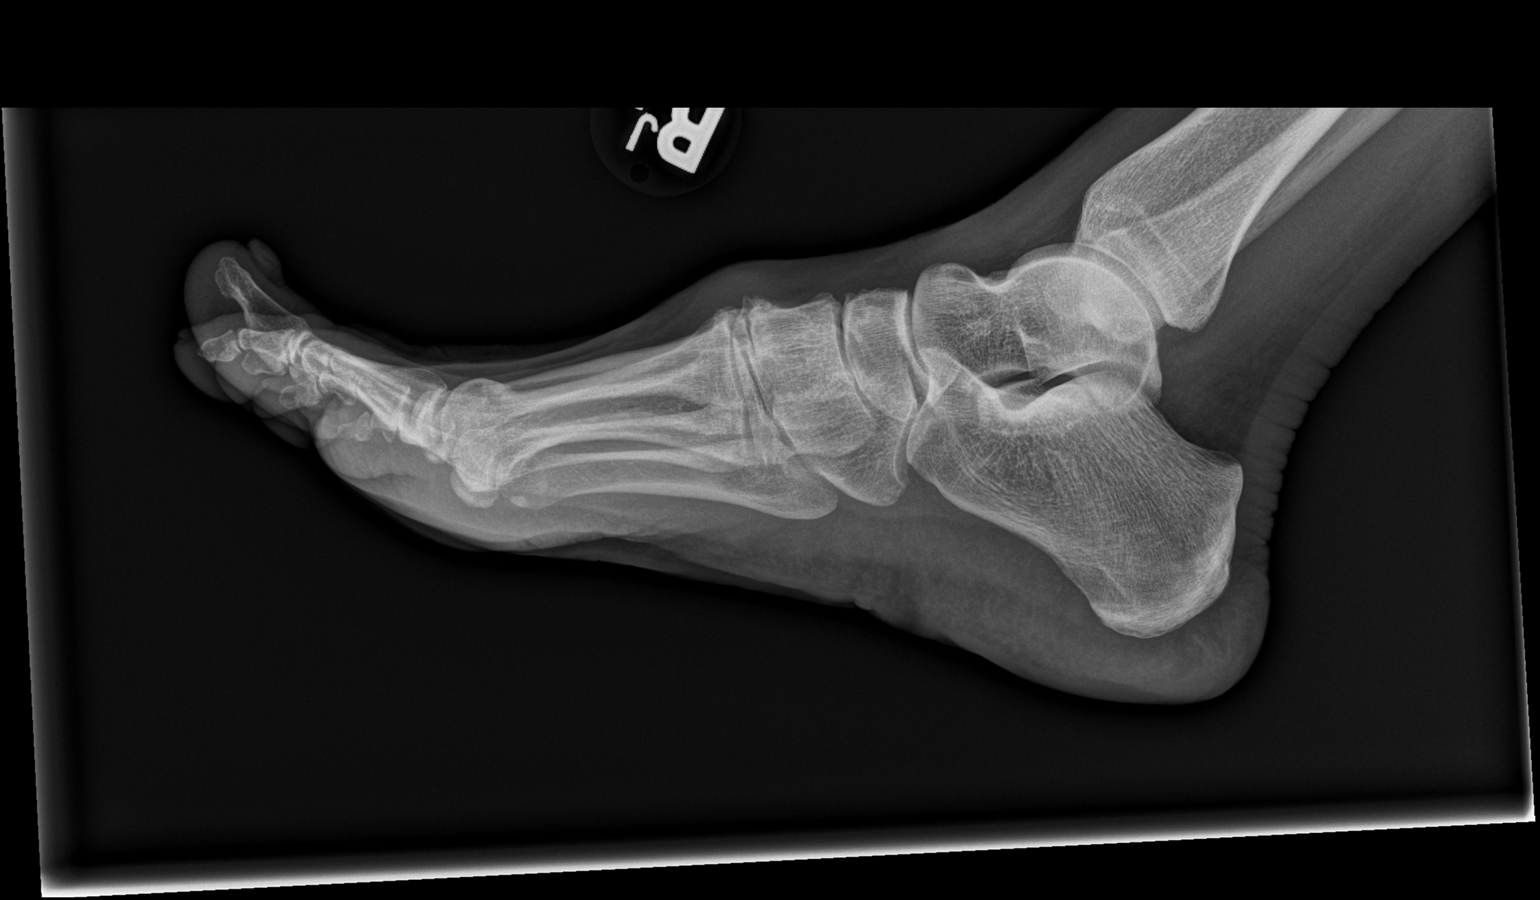

[3 of 3 positions shown; findings below may reference images not displayed]

FINDINGS: There is no evidence of fracture or dislocation. Marked osseous
spurring at the first tarsometatarsal joint, with soft tissue
swelling. No erosive change.
IMPRESSION: Degenerative joint disease of the foot most pronounced at the first
TMT joint. Soft tissue swelling is associated with the osseous
spurring.

## 2019-09-21 ENCOUNTER — Other Ambulatory Visit: Payer: Self-pay

## 2019-09-21 ENCOUNTER — Ambulatory Visit: Payer: 59 | Admitting: Podiatry

## 2019-10-03 ENCOUNTER — Ambulatory Visit (INDEPENDENT_AMBULATORY_CARE_PROVIDER_SITE_OTHER): Payer: 59

## 2019-10-03 ENCOUNTER — Ambulatory Visit (INDEPENDENT_AMBULATORY_CARE_PROVIDER_SITE_OTHER): Payer: 59 | Admitting: Orthopedic Surgery

## 2019-10-03 DIAGNOSIS — M79671 Pain in right foot: Secondary | ICD-10-CM

## 2019-10-03 DIAGNOSIS — M545 Low back pain, unspecified: Secondary | ICD-10-CM

## 2019-10-03 DIAGNOSIS — M19071 Primary osteoarthritis, right ankle and foot: Secondary | ICD-10-CM

## 2019-10-04 ENCOUNTER — Encounter: Payer: Self-pay | Admitting: Orthopedic Surgery

## 2019-10-04 NOTE — Progress Notes (Signed)
Office Visit Note   Patient: [name]           Date of Birth: 08/02/1960           MRN: [id number] Visit Date: 10/03/2019              Requested by: Juluis Rainier, MD [address] [place],  [place] [address] PCP: Juluis Rainier, MD  Chief Complaint  Patient presents with  . Right Foot - Pain  . Lower Back - Pain      HPI: Patient is a 59 year old woman who is an active runner.  She denies a history of diabetes hypertension or smoking.  She has had chronic pain in her midfoot on the right for about 6 years.  She also complains of lower back pain with occasional radicular symptoms.  Patient has seen a chiropractor for her back.  She also recently was walking her dog and and had an a acute pull on the dog leash causing a fall with muscular pain on the left side of her abdomen currently.  Assessment & Plan: Visit Diagnoses:  1. Pain in right foot   2. Low back pain, unspecified back pain laterality, unspecified chronicity, unspecified whether sciatica present   3. Primary osteoarthritis, right ankle and foot     Plan: Recommended heat for the lumbar spine.  For the bone spurs through the base of the first and second metatarsal recommended a carbon plate sole orthotics and a stiff sneaker to support the midfoot.  Also discussed over-the-counter anti-inflammatories or topical anti-inflammatories could be beneficial.  Discussed that if conservative treatment does not work a surgical option would be to consider a fusion across the base of the first and second metatarsal.  Patient's abdominal pain seems to be more of a muscle sprain from her recent fall.  Follow-Up Instructions: Return if symptoms worsen or fail to improve.   Ortho Exam  Patient is alert, oriented, no adenopathy, well-dressed, normal affect, normal respiratory effort. Examination patient has a good dorsalis pedis pulse on the right foot.  She has good dorsiflexion of ankle past neutral.  She has a large  palpable bony spur at the base of the first and second metatarsals this area is tender to palpation and the area of where she is having pain.  She has a negative straight leg raise bilaterally and no focal motor weakness in either lower extremity the plantar fascia is nontender to palpation.  Imaging: No results found. No images are attached to the encounter.  Labs: Lab Results  Component Value Date   REPTSTATUS 09/28/2008 FINAL 09/25/2008   REPTSTATUS 10/01/2008 FINAL 09/25/2008   GRAMSTAIN  09/25/2008    NO WBC SEEN RARE SQUAMOUS EPITHELIAL CELLS PRESENT NO ORGANISMS SEEN   GRAMSTAIN  09/25/2008    NO WBC SEEN RARE SQUAMOUS EPITHELIAL CELLS PRESENT NO ORGANISMS SEEN   CULT  09/25/2008    MULTIPLE ORGANISMS PRESENT, NONE PREDOMINANT NO STAPHYLOCOCCUS AUREUS ISOLATED NO GROUP A STREP (S.PYOGENES) ISOLATED   CULT NO ANAEROBES ISOLATED 09/25/2008     Lab Results  Component Value Date   ALBUMIN 3.6 02/20/2008    No results found for: MG No results found for: VD25OH  No results found for: PREALBUMIN CBC EXTENDED Latest Ref Rng & Units 02/20/2008  WBC 4.0 - 10.5 K/uL 3.6(L)  RBC 3.87 - 5.11 MIL/uL 4.46  HGB 12.0 - 15.0 g/dL 07.1  HCT 36 - 46 % 21.9  PLT 150 - 400 K/uL 176  NEUTROABS  1.7 - 7.7 K/uL 2.1  LYMPHSABS 0.7 - 4.0 K/uL 1.1     There is no height or weight on file to calculate BMI.  Orders:  Orders Placed This Encounter  Procedures  . XR Foot Complete Right  . XR Lumbar Spine 2-3 Views   No orders of the defined types were placed in this encounter.    Procedures: No procedures performed  Clinical Data: No additional findings.  ROS:  All other systems negative, except as noted in the HPI. Review of Systems  Objective: Vital Signs: There were no vitals taken for this visit.  Specialty Comments:  No specialty comments available.  PMFS History: Patient Active Problem List   Diagnosis Date Noted  . Plantar fasciitis 12/26/2014   History  reviewed. No pertinent past medical history.  History reviewed. No pertinent family history.  History reviewed. No pertinent surgical history. Social History   Occupational History  . Not on file  Tobacco Use  . Smoking status: Never Smoker  . Smokeless tobacco: Never Used  Substance and Sexual Activity  . Alcohol use: Not on file  . Drug use: Not on file  . Sexual activity: Not on file

## 2019-10-05 ENCOUNTER — Encounter: Payer: Self-pay | Admitting: Podiatry

## 2019-10-05 ENCOUNTER — Ambulatory Visit: Payer: 59

## 2019-10-05 ENCOUNTER — Ambulatory Visit (INDEPENDENT_AMBULATORY_CARE_PROVIDER_SITE_OTHER): Payer: 59 | Admitting: Podiatry

## 2019-10-05 ENCOUNTER — Telehealth: Payer: Self-pay | Admitting: Orthopedic Surgery

## 2019-10-05 ENCOUNTER — Other Ambulatory Visit: Payer: Self-pay

## 2019-10-05 DIAGNOSIS — M778 Other enthesopathies, not elsewhere classified: Secondary | ICD-10-CM | POA: Diagnosis not present

## 2019-10-05 DIAGNOSIS — L603 Nail dystrophy: Secondary | ICD-10-CM | POA: Diagnosis not present

## 2019-10-05 DIAGNOSIS — M19071 Primary osteoarthritis, right ankle and foot: Secondary | ICD-10-CM | POA: Diagnosis not present

## 2019-10-05 NOTE — Progress Notes (Signed)
  Subjective:  Patient ID: [name], female    DOB: [birth year],  MRN: [id number] HPI Chief Complaint  Patient presents with  . Foot Pain    Dorsal midfoot right - swollen, aching x years, worsening, very active with running, saw Dr. Terrilee Files- said had spurs and arthritis - no treatment  . Nail Problem    Hallux nails bilateral - thick and discolored x years, dermatologist said to soak, tried diflucan in the past  . New Patient (Initial Visit)    59 y.o. female presents with the above complaint.   ROS: Denies fever chills nausea vomiting muscle aches pains calf pain back pain chest pain shortness of breath.  No past medical history on file. No past surgical history on file.  Current Outpatient Medications:  .  SUMAtriptan (IMITREX) 50 MG tablet, Take 50 mg by mouth as needed., Disp: , Rfl:  .  VYVANSE 30 MG capsule, Take 30 mg by mouth daily., Disp: , Rfl:  .  zolpidem (AMBIEN) 5 MG tablet, Take 5 mg by mouth at bedtime as needed., Disp: , Rfl:   No Known Allergies Review of Systems Objective:  There were no vitals filed for this visit.  General: Well developed, nourished, in no acute distress, alert and oriented x3   Dermatological: Skin is warm, dry and supple bilateral. Nails x 10 are well maintained; remaining integument appears unremarkable at this time. There are no open sores, no preulcerative lesions, no rash or signs of infection present.  Vascular: Dorsalis Pedis artery and Posterior Tibial artery pedal pulses are 2/4 bilateral with immedate capillary fill time. Pedal hair growth present. No varicosities and no lower extremity edema present bilateral.   Neruologic: Grossly intact via light touch bilateral. Vibratory intact via tuning fork bilateral. Protective threshold with Semmes Wienstein monofilament intact to all pedal sites bilateral. Patellar and Achilles deep tendon reflexes 2+ bilateral. No Babinski or clonus noted bilateral.   Musculoskeletal: No gross  boney pedal deformities bilateral. No pain, crepitus, or limitation noted with foot and ankle range of motion bilateral. Muscular strength 5/5 in all groups tested bilateral.  She has pain with palpation dorsal medial aspect of the right foot.  Gait: Unassisted, Nonantalgic.    Radiographs:  Radiographs taken today demonstrate osseously mature individual with osteoarthritis of the first and second TMT joints right foot otherwise no acute findings.  Assessment & Plan:   Assessment: Porokeratosis TMT joint arthritis nail dystrophy.  Plan: Discussed etiology pathology conservative surgical therapies this time she did not want an injection so we did discuss topical therapy such as the use of diclofenac gel.  Took samples of the toenails to be sent for pathologic evaluation debrided all reactive hyperkeratotic tissue for her.     [name], [place]

## 2019-10-06 NOTE — Telephone Encounter (Signed)
e

## 2019-11-02 ENCOUNTER — Other Ambulatory Visit: Payer: Self-pay

## 2019-11-02 ENCOUNTER — Encounter: Payer: Self-pay | Admitting: Podiatry

## 2019-11-02 ENCOUNTER — Ambulatory Visit (INDEPENDENT_AMBULATORY_CARE_PROVIDER_SITE_OTHER): Payer: 59 | Admitting: Podiatry

## 2019-11-02 ENCOUNTER — Ambulatory Visit: Payer: 59 | Admitting: Orthotics

## 2019-11-02 DIAGNOSIS — L603 Nail dystrophy: Secondary | ICD-10-CM

## 2019-11-02 DIAGNOSIS — M19071 Primary osteoarthritis, right ankle and foot: Secondary | ICD-10-CM | POA: Diagnosis not present

## 2019-11-02 DIAGNOSIS — M778 Other enthesopathies, not elsewhere classified: Secondary | ICD-10-CM

## 2019-11-02 DIAGNOSIS — Z79899 Other long term (current) drug therapy: Secondary | ICD-10-CM

## 2019-11-02 DIAGNOSIS — M5431 Sciatica, right side: Secondary | ICD-10-CM

## 2019-11-02 MED ORDER — TERBINAFINE HCL 250 MG PO TABS: 250.0000 mg | ORAL_TABLET | Freq: Every day | ORAL | 0 refills | Status: DC

## 2019-11-02 NOTE — Patient Instructions (Signed)
Terbinafine tablets What is this medicine? TERBINAFINE (TER bin a feen) is an antifungal medicine. It is used to treat certain kinds of fungal or yeast infections. This medicine may be used for other purposes; ask your health care provider or pharmacist if you have questions. COMMON BRAND NAME(S): Lamisil, Terbinex What should I tell my health care provider before I take this medicine? They need to know if you have any of these conditions:  drink alcoholic beverages  kidney disease  liver disease  an unusual or allergic reaction to terbinafine, other medicines, foods, dyes, or preservatives  pregnant or trying to get pregnant  breast-feeding How should I use this medicine? Take this medicine by mouth with a full glass of water. Follow the directions on the prescription label. You can take this medicine with food or on an empty stomach. Take your medicine at regular intervals. Do not take your medicine more often than directed. Do not skip doses or stop your medicine early even if you feel better. Do not stop taking except on your doctor's advice. Talk to your pediatrician regarding the use of this medicine in children. Special care may be needed. Overdosage: If you think you have taken too much of this medicine contact a poison control center or emergency room at once. NOTE: This medicine is only for you. Do not share this medicine with others. What if I miss a dose? If you miss a dose, take it as soon as you can. If it is almost time for your next dose, take only that dose. Do not take double or extra doses. What may interact with this medicine? Do not take this medicine with any of the following medications:  thioridazine This medicine may also interact with the following medications:  beta-blockers  caffeine  cimetidine  cyclosporine  medicines for depression, anxiety, or psychotic disturbances  medicines for fungal infections like fluconazole and ketoconazole  medicines  for irregular heartbeat like amiodarone, flecainide and propafenone  rifampin  warfarin This list may not describe all possible interactions. Give your health care provider a list of all the medicines, herbs, non-prescription drugs, or dietary supplements you use. Also tell them if you smoke, drink alcohol, or use illegal drugs. Some items may interact with your medicine. What should I watch for while using this medicine? Visit your doctor or health care provider regularly. Tell your doctor right away if you have nausea or vomiting, loss of appetite, stomach pain on your right upper side, yellow skin, dark urine, light stools, or are over tired. Some fungal infections need many weeks or months of treatment to cure. If you are taking this medicine for a long time, you will need to have important blood work done. This medicine may cause serious skin reactions. They can happen weeks to months after starting the medicine. Contact your health care provider right away if you notice fevers or flu-like symptoms with a rash. The rash may be red or purple and then turn into blisters or peeling of the skin. Or, you might notice a red rash with swelling of the face, lips or lymph nodes in your neck or under your arms. What side effects may I notice from receiving this medicine? Side effects that you should report to your doctor or health care professional as soon as possible:  allergic reactions like skin rash or hives, swelling of the face, lips, or tongue  changes in vision  dark urine  fever or infection  general ill feeling or flu-like symptoms    light-colored stools  loss of appetite, nausea  rash, fever, and swollen lymph nodes  redness, blistering, peeling or loosening of the skin, including inside the mouth  right upper belly pain  unusually weak or tired  yellowing of the eyes or skin Side effects that usually do not require medical attention (report to your doctor or health care  professional if they continue or are bothersome):  changes in taste  diarrhea  hair loss  muscle or joint pain  stomach gas  stomach upset This list may not describe all possible side effects. Call your doctor for medical advice about side effects. You may report side effects to FDA at 1-800-FDA-1088. Where should I keep my medicine? Keep out of the reach of children. Store at room temperature below 25 degrees C (77 degrees F). Protect from light. Throw away any unused medicine after the expiration date. NOTE: This sheet is a summary. It may not cover all possible information. If you have questions about this medicine, talk to your doctor, pharmacist, or health care provider.  2020 Elsevier/Gold Standard (2018-04-22 15:37:07)  

## 2019-11-02 NOTE — Progress Notes (Signed)
havent submitted to [name] yet as she still needs to decide she wants to pursue.

## 2019-11-02 NOTE — Progress Notes (Signed)
She presents today for follow-up of her pathology results from her hallux nails bilaterally.  She states that I feel like this is growing from inside because it seems to be in my toenail of her right foot as well.  She also goes on to say that she has been on Lamisil and Diflucan in the past no side effects that she knew of.  She is also complaining of radiating pain from her buttocks down the lateral aspect of her foot and feels that may be the pain in the dorsal aspect of her foot is related to the pain in her thigh hip and buttocks.  She is complaining more of a consistent pain from her lower back down her buttocks and the posterior thigh.  She does have pain in her common peroneal nerve area as well as she demonstrates that to [place] stating that she has had massage therapy that seems to help alleviate to some degree.  She has seen Dr. Lajoyce Corners in the past who she feels did not seem to be overly concerned about her back or her foot.  I explained to her that he was more foot and leg orthopedist then back and hip.  Objective: Vital signs are stable alert and oriented x3.  Pulses are palpable.  She has moderate to severe pain on palpation of the right foot through the first and second tarsometatarsal joints.  The rest of it she has very little pain with exception of some tenderness on to the lateral aspects of the distal metatarsals but that is most likely compensatory in nature avoiding the medial aspect of the foot.  Pathology results today do demonstrate onychomycosis as well as the yeast infection.  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  Sciatica most likely right.  Onychomycosis.  Plan: Discussed etiology pathology and surgical therapies at this point time requested liver profile with a CMP as well as start her on her first 30 days of Lamisil discussed pros and cons of use of the Lamisil and discuss other options such as laser therapy.  Offered her injection to the dorsal  aspect of foot she declined.  She Reola Mosher today to see about having some orthotics made.

## 2019-12-04 ENCOUNTER — Telehealth: Payer: Self-pay | Admitting: Podiatry

## 2019-12-04 NOTE — Telephone Encounter (Signed)
Pt wanted to know where she should get her blood work done, she does not have the referral. Please call patient

## 2019-12-05 NOTE — Telephone Encounter (Signed)
L/M with address of [facility] on [address].

## 2019-12-12 ENCOUNTER — Ambulatory Visit: Payer: 59 | Admitting: Podiatry

## 2020-05-23 ENCOUNTER — Other Ambulatory Visit (HOSPITAL_COMMUNITY): Payer: Self-pay | Admitting: Family Medicine

## 2020-05-23 DIAGNOSIS — R600 Localized edema: Secondary | ICD-10-CM

## 2020-10-15 ENCOUNTER — Other Ambulatory Visit: Payer: Self-pay | Admitting: Family Medicine

## 2020-10-15 DIAGNOSIS — Z8249 Family history of ischemic heart disease and other diseases of the circulatory system: Secondary | ICD-10-CM

## 2020-10-23 ENCOUNTER — Other Ambulatory Visit: Payer: Self-pay | Admitting: Family Medicine

## 2020-10-23 DIAGNOSIS — Z0000 Encounter for general adult medical examination without abnormal findings: Secondary | ICD-10-CM

## 2020-10-23 DIAGNOSIS — Z1231 Encounter for screening mammogram for malignant neoplasm of breast: Secondary | ICD-10-CM

## 2020-11-14 ENCOUNTER — Other Ambulatory Visit: Payer: 59

## 2020-11-20 ENCOUNTER — Ambulatory Visit: Payer: 59

## 2020-11-20 ENCOUNTER — Inpatient Hospital Stay: Admission: RE | Admit: 2020-11-20 | Payer: 59 | Source: Ambulatory Visit

## 2021-08-13 ENCOUNTER — Ambulatory Visit: Payer: 59 | Admitting: Podiatry

## 2021-08-28 ENCOUNTER — Ambulatory Visit: Payer: 59 | Admitting: Podiatry

## 2022-05-04 ENCOUNTER — Encounter: Payer: Self-pay | Admitting: Podiatry

## 2022-05-04 ENCOUNTER — Ambulatory Visit (INDEPENDENT_AMBULATORY_CARE_PROVIDER_SITE_OTHER): Payer: Commercial Managed Care - PPO | Admitting: Podiatry

## 2022-05-04 VITALS — Ht 63.0 in | Wt 102.0 lb

## 2022-05-04 DIAGNOSIS — M7989 Other specified soft tissue disorders: Secondary | ICD-10-CM

## 2022-05-04 DIAGNOSIS — B351 Tinea unguium: Secondary | ICD-10-CM

## 2022-05-04 DIAGNOSIS — R768 Other specified abnormal immunological findings in serum: Secondary | ICD-10-CM

## 2022-05-04 DIAGNOSIS — M79676 Pain in unspecified toe(s): Secondary | ICD-10-CM | POA: Diagnosis not present

## 2022-05-04 MED ORDER — MELOXICAM 15 MG PO TABS: 15.0000 mg | ORAL_TABLET | Freq: Every day | ORAL | 3 refills | Status: DC

## 2022-05-04 MED ORDER — TERBINAFINE HCL 250 MG PO TABS: 250.0000 mg | ORAL_TABLET | Freq: Every day | ORAL | 0 refills | Status: DC

## 2022-05-04 NOTE — Progress Notes (Signed)
She presents today with chief concern of nail fungus to the hallux bilaterally and the second is lancinating that these areas are tender for her.  She is also currently taking on Lamisil.  She is wondering why her feet are becoming sweaty and she is getting older and the odor seems to be getting worse.  She states that she is very active running and walking as well as swimming.  Is having pain in her midfoot right over left.  Objective: Vital signs stable alert oriented x 3 moderate to severe pain on palpation of the right foot for the first and second metatarsal and tarsometatarsal joint.  She does demonstrate extension at the interphalangeal joint of the right hallux consistent with to wear 3 pattern of her shoes dorsally.  Nail dystrophy and onychomycosis from previous cultures.  She does have swelling of her carpometacarpal joints metacarpophalangeal joints as well as tarsometatarsal joints and metatarsophalangeal joints.  There is also some deviation.  She does relate a history of positive ANA but never had workup.  Assessment: Cannot rule out osteoarthritis or seropositive arthropathy.  Onychomycosis bilateral.  Osteoarthritis bilateral.  Plan: Discussed etiology pathology conservative versus surgical therapies started he another 60 of Lamisil and I will follow-up with her in 3 months for this.  Also start her back on 50 mg of meloxicam No. 90 with 3 refills.

## 2022-05-11 ENCOUNTER — Telehealth: Payer: Self-pay | Admitting: Podiatry

## 2022-05-11 NOTE — Telephone Encounter (Signed)
Pt called and the medications that were to have been called in at her last appt  on 4.8.2024 where not sent in.It was for the meloxicam and lamisil. Also the after visit summary she received did not have a lot of the information that was discussed at appt.I apologized and told the AVS was probably not updated yet. Please let me know and I can call pt.

## 2022-05-12 ENCOUNTER — Other Ambulatory Visit: Payer: Self-pay | Admitting: Podiatry

## 2022-05-12 MED ORDER — TERBINAFINE HCL 250 MG PO TABS: 250.0000 mg | ORAL_TABLET | Freq: Every day | ORAL | 0 refills | Status: AC

## 2022-05-12 NOTE — Telephone Encounter (Signed)
Notified pt medication were sent in and she is asking if I could email her a copy of the note/AVS as the one she got was not updated. Her email is [email or web address]. I told her I would. She is going to try to sign into her mychart as well and if any issues she is to call us back for [place] to help with chnaging her password to [employer].

## 2022-08-04 NOTE — Progress Notes (Signed)
Office Visit Note  Patient: [name]             Date of Birth: 1960/02/06           MRN: [id number]             PCP: Gweneth Dimitri, MD Referring: Valerie James, MD Visit Date: 08/18/2022 Occupation: @GUAROCC @  Subjective:  raynauds phenomenon, hair loss, joint pain  History of Present Illness: [name] is a 62 y.o. female seen in consultation per request of Dr. Wynelle Link.  According the patient she has had history of Raynauds since she was in her 32s.  She came to see me in 2011 for Raynauds and positive ANA referred by Dr. [place] her dermatologist then.  The autoimmune workup was negative except for positive ANA and Raynauds.  She was given a prescription for topical nitroglycerin.  At that time she was also experiencing some discomfort in her feet which were consistent with osteoarthritis.  According to the patient she is has also suffered from onychomycosis over the years.  She has been seeing Dr. Al Corpus for osteoarthritis in her feet.  She is also jogs on a regular basis.  She had recent x-rays by Dr. Al Corpus which showed osteoarthritis in the midfoot where she has most discomfort.  She has been experiencing hair loss for the last 3 years.  She states her hair breaks off and also she had.  She had been under care of Dr. Emily Filbert.  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  She also has extreme itching all over her body and her scalp.  She was given gabapentin for that.  She was seen by [facility] dermatologist and [facility] dermatology.  She was suggested gabapentin and Atarax.  She also uses topical steroid agents.  She has been experiencing paresthesias on her scalp hands and feet.  She gives history of dry mouth, dry eyes, Raynauds, photosensitivity.  There is no history of inflammatory arthritis.  She has discomfort in her hands and feet but none of the other joints are painful.  Her mother had Raynauds but no other family history of autoimmune disease.   She is gravida 0.  She is retired.  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  She enjoys jogging, aerobics, swimming, painting and tennis.  She has not played tennis in a while.    Activities of Daily Living:  Patient reports morning stiffness for a few minutes.   Patient Reports nocturnal pain.  Difficulty dressing/grooming: Denies Difficulty climbing stairs: Denies Difficulty getting out of chair: Denies Difficulty using hands for taps, buttons, cutlery, and/or writing: Reports  Review of Systems  Constitutional:  Positive for fatigue.  HENT:  Positive for mouth dryness. Negative for mouth sores.   Eyes:  Positive for dryness.  Respiratory:  Negative for shortness of breath.   Cardiovascular:  Negative for palpitations.  Gastrointestinal:  Positive for diarrhea. Negative for blood in stool and constipation.  Endocrine: Negative for increased urination.  Genitourinary:  Negative for involuntary urination.  Musculoskeletal:  Positive for joint pain, joint pain, joint swelling, myalgias, muscle weakness, morning stiffness, muscle tenderness and myalgias. Negative for gait problem.  Skin:  Positive for color change, rash, hair loss and sensitivity to sunlight.  Allergic/Immunologic: Negative for susceptible to infections.  Neurological:  Positive for numbness and headaches. Negative for dizziness.  Hematological:  Negative for swollen glands.  Psychiatric/Behavioral:  Positive for sleep disturbance. Negative for depressed mood. The patient is nervous/anxious.  PMFS History:  Patient Active Problem List   Diagnosis Date Noted   Plantar fasciitis 12/26/2014    Past Medical History:  Diagnosis Date   History of alopecia    dx by Dr. Emily Filbert per patient   History of depression    History of hypothyroidism     Family History  Problem Relation Age of Onset   Raynaud syndrome Mother    Heart disease Father    Asthma Sister    Thyroid disease Sister    Past Surgical  History:  Procedure Laterality Date   BREAST SURGERY Left    WISDOM TOOTH EXTRACTION     3 removed   Social History   Social History Narrative   Not on file    There is no immunization history on file for this patient.   Objective: Vital Signs: BP 105/69 (BP Location: Right Arm, Patient Position: Sitting, Cuff Size: Normal)   Pulse 75   Resp 16   Ht 5' 3.5" (1.613 m)   Wt 92 lb 9.6 oz (42 kg)   BMI 16.15 kg/m    Physical Exam Vitals and nursing note reviewed.  Constitutional:      Appearance: She is well-developed.  HENT:     Head: Normocephalic and atraumatic.  Eyes:     Conjunctiva/sclera: Conjunctivae normal.  Cardiovascular:     Rate and Rhythm: Normal rate and regular rhythm.     Heart sounds: Normal heart sounds.  Pulmonary:     Effort: Pulmonary effort is normal.     Breath sounds: Normal breath sounds.  Abdominal:     General: Bowel sounds are normal.     Palpations: Abdomen is soft.  Musculoskeletal:     Cervical back: Normal range of motion.  Lymphadenopathy:     Cervical: No cervical adenopathy.  Skin:    General: Skin is warm and dry.     Capillary Refill: Capillary refill takes less than 2 seconds.  Neurological:     Mental Status: She is alert and oriented to person, place, and time.  Psychiatric:        Behavior: Behavior normal.      Musculoskeletal Exam: Cervical, thoracic and lumbar spine were in good range of motion.  Shoulder joints, elbow joints, wrist joints were in good range of motion.  She had bilateral CMC PIP and DIP thickening consistent with osteoarthritis with no synovitis.  Hip joints and knee joints were in good range of motion without any warmth swelling or effusion.  She had bilateral dorsal spurs, PIP and DIP thickening with no synovitis.  CDAI Exam: CDAI Score: -- Patient Global: --; Provider Global: -- Swollen: --; Tender: -- Joint Exam 08/18/2022   No joint exam has been documented for this visit   There is  currently no information documented on the homunculus. Go to the Rheumatology activity and complete the homunculus joint exam.  Investigation: No additional findings.  Imaging: XR Hand 2 View Left  Result Date: 08/18/2022 Severe CMC narrowing and subluxation was noted.  PIP and DIP narrowing was noted.  Extra-articular calcification was noted around the third PIP joint.  No MCP narrowing was noted.  No intercarpal or radiocarpal joint space narrowing was noted. Impression: These findings are suggestive of osteoarthritis of the hand.  XR Hand 2 View Right  Result Date: 08/18/2022 CMC, PIP and DIP narrowing was noted.  Severe second MCP narrowing and moderate third MCP narrowing with cystic changes were noted.  No intercarpal or radiocarpal joint space narrowing  was noted. Impression: These findings are suggestive of osteoarthritis and possible inflammatory arthritis.   Recent Labs: Lab Results  Component Value Date   WBC 3.6 (L) 02/20/2008   HGB 13.2 02/20/2008   PLT 176 02/20/2008   NA 138 02/20/2008   K 2.8 (L) 02/20/2008   CL 97 02/20/2008   CO2 35 (H) 02/20/2008   GLUCOSE 106 (H) 02/20/2008   BUN 15 02/20/2008   CREATININE 0.82 02/20/2008   BILITOT 0.8 02/20/2008   ALKPHOS 57 02/20/2008   AST 30 02/20/2008   ALT 27 02/20/2008   PROT 5.8 (L) 02/20/2008   ALBUMIN 3.6 02/20/2008   CALCIUM 9.0 02/20/2008   GFRAA  02/20/2008    >60        The eGFR has been calculated using the MDRD equation. This calculation has not been validated in all clinical situations. eGFR's persistently <60 mL/min signify possible Chronic Kidney Disease.   January 05, 2022 ANA 1: 80NS, RF<14, TSH normal, testosterone normal, CMP normal, iron studies normal, CBC WBC 3.8, hemoglobin 14.9, platelets 277, B12 normal, LDL 160 Jun 18, 2022 heavy metal screen negative, Lyme negative, CMP creatinine 0.80, AST 31, ALT 27, immunoglobulins IgA low at 3, copper normal, Candida antibody IgG positive, IgM  negative, testosterone normal, estradiol less than 5, RPR nonreactive, vitamin D normal, zinc normal, BBC negative, Bartonella negative, GGT normal, progesterone normal estrogen normal, ferritin normal, C-reactive protein negative, ANA 1: 160 speckled, anticentromere negative, RNA polymerase 3 negative, SCL 70 negative, Jo 1 RNP negative, PM SCL negative,  Speciality Comments: No specialty comments available.  Procedures:  No procedures performed Allergies: Patient has no known allergies.   Assessment / Plan:     Visit Diagnoses: Pain in both hands -she complains of pain and stiffness in her bilateral hands.  Bilateral CMC PIP and DIP thickening was noted.  No synovitis was noted.  Clinical findings are consistent with osteoarthritis.  Plan: XR Hand 2 View Right, XR Hand 2 View Left.  X-rays of bilateral hands were consistent with osteoarthritis.  Right hand second and third significant MCP narrowing was noted.  Which raises concern of inflammatory arthritis.  Extra-articular calcification was noted around left third PIP joint.  Positive ANA (antinuclear antibody)-she has had positive ANA for multiple years.  Her ANA was positive in 2011.  She continues to have positive ANA.  SCL 70 and centromere antibodies were negative recently.  She gives history of sicca symptoms, Raynaud's, photosensitivity, hair loss and arthralgias.  There is no history of lymphadenopathy.  I will obtain ENA panel, C3-C4, anticardiolipin, beta-2 and lupus anticoagulant today.  Raynaud's phenomenon without gangrene-she gives history of Raynaud's since she was in her 37s.  No Telangiectasia, nailbed capillary changes were noted.  Primary osteoarthritis of both feet-she has been followed by Dr. Al Corpus.  She had dorsal spurs and osteoarthritis in her feet.  She also jogs and does aerobics.  No synovitis was noted.  Hair loss-patient is struggling from hair loss for the last few years.  She has been under care of Dr. Emily Filbert and had  extensive workup.  She was also evaluated by [facility] dermatology and [facility] dermatology.  She was given gabapentin for pruritus.  She is also on Prozac and Atarax.  Pruritus-she is currently on Atarax.  Other depression-patient states her depression is related to her health issues.  Adult attention deficit hyperactivity disorder  Somatization disorder  Orders: Orders Placed This Encounter  Procedures   XR Hand 2 View Right  XR Hand 2 View Left   Protein / creatinine ratio, urine   CBC with Differential/Platelet   COMPLETE METABOLIC PANEL WITH GFR   ANA   RNP Antibody   Anti-Smith antibody   Sjogrens syndrome-A extractable nuclear antibody   Sjogrens syndrome-B extractable nuclear antibody   Anti-DNA antibody, double-stranded   C3 and C4   Beta-2 glycoprotein antibodies   Cardiolipin antibodies, IgG, IgM, IgA   Lupus Anticoagulant Eval w/Reflex   Glucose 6 phosphate dehydrogenase   No orders of the defined types were placed in this encounter.    Follow-Up Instructions: Return for +ANA, Raynauds.   Pollyann Savoy, MD  Note - This record has been created using Animal nutritionist.  Chart creation errors have been sought, but may not always  have been located. Such creation errors do not reflect on  the standard of medical care.

## 2022-08-18 ENCOUNTER — Ambulatory Visit: Payer: Commercial Managed Care - PPO

## 2022-08-18 ENCOUNTER — Ambulatory Visit: Payer: Commercial Managed Care - PPO | Attending: Rheumatology | Admitting: Rheumatology

## 2022-08-18 ENCOUNTER — Encounter: Payer: Self-pay | Admitting: Rheumatology

## 2022-08-18 ENCOUNTER — Ambulatory Visit (INDEPENDENT_AMBULATORY_CARE_PROVIDER_SITE_OTHER): Payer: Commercial Managed Care - PPO

## 2022-08-18 VITALS — BP 105/69 | HR 75 | Resp 16 | Ht 63.5 in | Wt 92.6 lb

## 2022-08-18 DIAGNOSIS — R768 Other specified abnormal immunological findings in serum: Secondary | ICD-10-CM

## 2022-08-18 DIAGNOSIS — L299 Pruritus, unspecified: Secondary | ICD-10-CM

## 2022-08-18 DIAGNOSIS — R5383 Other fatigue: Secondary | ICD-10-CM

## 2022-08-18 DIAGNOSIS — M79642 Pain in left hand: Secondary | ICD-10-CM | POA: Diagnosis not present

## 2022-08-18 DIAGNOSIS — M79641 Pain in right hand: Secondary | ICD-10-CM

## 2022-08-18 DIAGNOSIS — F3289 Other specified depressive episodes: Secondary | ICD-10-CM

## 2022-08-18 DIAGNOSIS — I7300 Raynaud's syndrome without gangrene: Secondary | ICD-10-CM

## 2022-08-18 DIAGNOSIS — L659 Nonscarring hair loss, unspecified: Secondary | ICD-10-CM

## 2022-08-18 DIAGNOSIS — M19072 Primary osteoarthritis, left ankle and foot: Secondary | ICD-10-CM

## 2022-08-18 DIAGNOSIS — M19071 Primary osteoarthritis, right ankle and foot: Secondary | ICD-10-CM | POA: Diagnosis not present

## 2022-08-18 DIAGNOSIS — F909 Attention-deficit hyperactivity disorder, unspecified type: Secondary | ICD-10-CM

## 2022-08-18 DIAGNOSIS — E038 Other specified hypothyroidism: Secondary | ICD-10-CM

## 2022-08-18 DIAGNOSIS — F450 Somatization disorder: Secondary | ICD-10-CM

## 2022-08-18 LAB — CBC WITH DIFFERENTIAL/PLATELET
Basophils Relative: 0.6 %
Hemoglobin: 13.2 g/dL (ref 11.7–15.5)
Monocytes Relative: 11.9 %
RDW: 11.9 % (ref 11.0–15.0)

## 2022-08-19 LAB — CBC WITH DIFFERENTIAL/PLATELET
Absolute Monocytes: 369 cells/uL (ref 200–950)
Basophils Absolute: 19 cells/uL (ref 0–200)
Eosinophils Relative: 1.6 %
Lymphs Abs: 822 cells/uL — ABNORMAL LOW (ref 850–3900)
MCH: 30.3 pg (ref 27.0–33.0)
MCHC: 33.4 g/dL (ref 32.0–36.0)
MCV: 90.8 fL (ref 80.0–100.0)
MPV: 10.5 fL (ref 7.5–12.5)
Neutro Abs: 1841 cells/uL (ref 1500–7800)
Platelets: 239 10*3/uL (ref 140–400)
RBC: 4.35 10*6/uL (ref 3.80–5.10)
WBC: 3.1 10*3/uL — ABNORMAL LOW (ref 3.8–10.8)

## 2022-08-19 LAB — PROTEIN / CREATININE RATIO, URINE
Creatinine, Urine: 65 mg/dL (ref 20–275)
Protein/Creat Ratio: 92 mg/g creat (ref 24–184)
Protein/Creatinine Ratio: 0.092 mg/mg creat (ref 0.024–0.184)
Total Protein, Urine: 6 mg/dL (ref 5–24)

## 2022-08-19 LAB — COMPLETE METABOLIC PANEL WITH GFR
ALT: 27 U/L (ref 6–29)
Albumin: 4.3 g/dL (ref 3.6–5.1)
CO2: 28 mmol/L (ref 20–32)
Chloride: 103 mmol/L (ref 98–110)
Creat: 0.72 mg/dL (ref 0.50–1.05)
Globulin: 2.1 g/dL (calc) (ref 1.9–3.7)
Glucose, Bld: 110 mg/dL — ABNORMAL HIGH (ref 65–99)
Total Bilirubin: 0.3 mg/dL (ref 0.2–1.2)
Total Protein: 6.4 g/dL (ref 6.1–8.1)

## 2022-08-19 LAB — SJOGRENS SYNDROME-B EXTRACTABLE NUCLEAR ANTIBODY: SSB (La) (ENA) Antibody, IgG: <1 AI

## 2022-08-19 LAB — SJOGRENS SYNDROME-A EXTRACTABLE NUCLEAR ANTIBODY: SSA (Ro) (ENA) Antibody, IgG: <1 AI

## 2022-08-20 LAB — COMPLETE METABOLIC PANEL WITH GFR
AG Ratio: 2 (calc) (ref 1.0–2.5)
AST: 28 U/L (ref 10–35)
Alkaline phosphatase (APISO): 67 U/L (ref 37–153)
BUN: 16 mg/dL (ref 7–25)
Calcium: 9.4 mg/dL (ref 8.6–10.4)
Potassium: 4.5 mmol/L (ref 3.5–5.3)
Sodium: 139 mmol/L (ref 135–146)
eGFR: 94 mL/min/{1.73_m2} (ref >=60)

## 2022-08-20 LAB — CBC WITH DIFFERENTIAL/PLATELET
Eosinophils Absolute: 50 cells/uL (ref 15–500)
HCT: 39.5 % (ref 35.0–45.0)
Neutrophils Relative %: 59.4 %
Total Lymphocyte: 26.5 %

## 2022-08-20 LAB — ANTI-SMITH ANTIBODY: ENA SM Ab Ser-aCnc: <1 AI

## 2022-08-20 LAB — ANTI-DNA ANTIBODY, DOUBLE-STRANDED: ds DNA Ab: <1 IU/mL

## 2022-08-20 LAB — C3 AND C4
C3 Complement: 95 mg/dL (ref 83–193)
C4 Complement: 14 mg/dL — ABNORMAL LOW (ref 15–57)

## 2022-08-20 LAB — ANTI-NUCLEAR AB-TITER (ANA TITER): ANA Titer 1: 1:40 {titer} — ABNORMAL HIGH

## 2022-08-20 LAB — RNP ANTIBODY: Ribonucleic Protein(ENA) Antibody, IgG: <1 AI

## 2022-08-20 LAB — ANA: Anti Nuclear Antibody (ANA): POSITIVE — AB

## 2022-08-25 NOTE — Progress Notes (Signed)
Office Visit Note  Patient: [name]             Date of Birth: 05-26-1960           MRN: [id number]             PCP: Gweneth Dimitri, MD Referring: Deatra James, MD Visit Date: 09/08/2022 Occupation: @GUAROCC @  Subjective:  Joint pain and stiffness  History of Present Illness: [name] is a 62 y.o. female with Raynaud's phenomenon, positive ANA and arthralgias.  She returns for the follow-up visit today.  Patient states that she continues to have pain in multiple joints and intermittent swelling in her hands.  She complains of dry mouth and dry eyes.  She gives history of raynaud's phenomenon , photosensitivity and hair loss.  She states Raynauds symptoms are worse during the winter months.    Activities of Daily Living:  Patient reports morning stiffness for 0  none .   Patient Reports nocturnal pain.  Difficulty dressing/grooming: Reports Difficulty climbing stairs: Denies Difficulty getting out of chair: Denies Difficulty using hands for taps, buttons, cutlery, and/or writing: Reports  Review of Systems  Constitutional:  Negative for fatigue.  HENT:  Positive for mouth dryness. Negative for mouth sores.   Eyes:  Positive for dryness.  Respiratory:  Negative for shortness of breath.   Cardiovascular:  Negative for chest pain and palpitations.  Gastrointestinal:  Positive for diarrhea. Negative for blood in stool and constipation.  Endocrine: Negative for increased urination.  Genitourinary:  Positive for involuntary urination.  Musculoskeletal:  Positive for joint pain, gait problem, joint pain, joint swelling, myalgias, muscle tenderness and myalgias. Negative for muscle weakness and morning stiffness.  Skin:  Positive for color change, hair loss and sensitivity to sunlight. Negative for rash.  Allergic/Immunologic: Negative for susceptible to infections.  Neurological:  Negative for dizziness and headaches.  Hematological:  Negative for swollen glands.   Psychiatric/Behavioral:  Positive for sleep disturbance. Negative for depressed mood. The patient is not nervous/anxious.     PMFS History:  Patient Active Problem List   Diagnosis Date Noted   Plantar fasciitis 12/26/2014    Past Medical History:  Diagnosis Date   History of alopecia    dx by Dr. Emily Filbert per patient   History of depression    History of hypothyroidism     Family History  Problem Relation Age of Onset   Raynaud syndrome Mother    Heart disease Father    Asthma Sister    Thyroid disease Sister    Past Surgical History:  Procedure Laterality Date   BREAST SURGERY Left    WISDOM TOOTH EXTRACTION     3 removed   Social History   Social History Narrative   Not on file    There is no immunization history on file for this patient.   Objective: Vital Signs: BP 111/70 (BP Location: Left Arm, Patient Position: Sitting, Cuff Size: Normal)   Pulse 77   Resp 14   Ht 5' 3.5" (1.613 m)   Wt 90 lb (40.8 kg)   BMI 15.69 kg/m    Physical Exam Vitals and nursing note reviewed.  Constitutional:      Appearance: She is well-developed.  HENT:     Head: Normocephalic and atraumatic.  Eyes:     Conjunctiva/sclera: Conjunctivae normal.  Cardiovascular:     Rate and Rhythm: Normal rate and regular rhythm.     Heart sounds: Normal heart sounds.  Pulmonary:  Effort: Pulmonary effort is normal.     Breath sounds: Normal breath sounds.  Abdominal:     General: Bowel sounds are normal.     Palpations: Abdomen is soft.  Musculoskeletal:     Cervical back: Normal range of motion.  Lymphadenopathy:     Cervical: No cervical adenopathy.  Skin:    General: Skin is warm and dry.     Capillary Refill: Capillary refill takes 2 to 3 seconds.  Neurological:     Mental Status: She is alert and oriented to person, place, and time.  Psychiatric:        Behavior: Behavior normal.      Musculoskeletal Exam: Cervical, thoracic and lumbar spine were in good range of  motion.  Shoulder joints, elbow joints, wrist joints were in good range of motion.  She had bilateral CMC PIP and DIP thickening.  She also had MCP thickening with no synovitis.  Hip joints and knee joints were in good range of motion without any warmth swelling or effusion.  There was no tenderness over ankles or MTPs.  She had bilateral dorsal spurs.  CDAI Exam: CDAI Score: -- Patient Global: --; Provider Global: -- Swollen: --; Tender: -- Joint Exam 09/08/2022   No joint exam has been documented for this visit   There is currently no information documented on the homunculus. Go to the Rheumatology activity and complete the homunculus joint exam.  Investigation: No additional findings.  Imaging: XR Hand 2 View Left  Result Date: 08/18/2022 Severe CMC narrowing and subluxation was noted.  PIP and DIP narrowing was noted.  Extra-articular calcification was noted around the third PIP joint.  No MCP narrowing was noted.  No intercarpal or radiocarpal joint space narrowing was noted. Impression: These findings are suggestive of osteoarthritis of the hand.  XR Hand 2 View Right  Result Date: 08/18/2022 CMC, PIP and DIP narrowing was noted.  Severe second MCP narrowing and moderate third MCP narrowing with cystic changes were noted.  No intercarpal or radiocarpal joint space narrowing was noted. Impression: These findings are suggestive of osteoarthritis and possible inflammatory arthritis.   Recent Labs: Lab Results  Component Value Date   WBC 3.1 (L) 08/18/2022   HGB 13.2 08/18/2022   PLT 239 08/18/2022   NA 139 08/18/2022   K 4.5 08/18/2022   CL 103 08/18/2022   CO2 28 08/18/2022   GLUCOSE 110 (H) 08/18/2022   BUN 16 08/18/2022   CREATININE 0.72 08/18/2022   BILITOT 0.3 08/18/2022   ALKPHOS 57 02/20/2008   AST 28 08/18/2022   ALT 27 08/18/2022   PROT 6.4 08/18/2022   ALBUMIN 3.6 02/20/2008   CALCIUM 9.4 08/18/2022   GFRAA  02/20/2008    >60        The eGFR has been  calculated using the MDRD equation. This calculation has not been validated in all clinical situations. eGFR's persistently <60 mL/min signify possible Chronic Kidney Disease.   August 18, 2022 ANA 1: 40 NS, ENA (RNP, [place], [place], SSB, dsDNA) negative, C3 95, C4 14, G6PD normal, lupus anticoagulant negative, anticardiolipin negative, beta-2 GP 1 negative  Speciality Comments: No specialty comments available.  Procedures:  No procedures performed Allergies: Patient has no known allergies.   Assessment / Plan:     Visit Diagnoses: Positive ANA (antinuclear antibody) - History of Raynauds, sicca symptoms, photosensitivity, hair loss and arthralgias.  She also has low titer positive ANA low C4, neutropenia and lymphopenia.  Patient is concerned that she  may have underlying autoimmune disease.  She had synovial thickening over MCP joints but no synovitis was noted.  X-rays of her hands showed narrowing of the MCP joints.  We had a detailed discussion regarding her current symptoms and low titer positive serology.  Different treatment options and their side effects were discussed.  We discussed possible trial of hydroxychloroquine to see if her symptoms would improve.   Indications side effects contraindications of hydroxychloroquine were discussed at length.  Increased risk of prolonged QT interval with Vyvanse and Prozac was discussed.  Patient states she might be coming of both medications in the future.  I advised her to let me know if she comes of both medications.  She also wanted some information on other options.  After discussing side effects of Imuran, I gave her a handout on Imuran to review.  Patient will discuss drug interactions with her PCP and cardiologist.  Raynaud's phenomenon without gangrene -patient continues to have Raynaud's phenomenon.  No nailbed capillary changes were noted.  She states her symptoms are worse during the winter months.  Neutropenia and lymphopenia was noted.  Low  titer positive ANA, low C4, ENA panel negative, anticardiolipin, beta-2 GP 1, lupus anticoagulant negative.  Lab results were discussed with the patient at length.  Keeping core temperature warm and warm clothing was discussed.  Pain in both hands -she continues to have pain and discomfort in her bilateral hands.  Clinical and radiographic findings were consistent with osteoarthritis.  Right second and third MCP narrowing, extra-articular calcification left third PIP.  Due to narrowing of the MCP joints I will also obtain following labs today.- Plan: Rheumatoid factor, Cyclic citrul peptide antibody, IgG  Primary osteoarthritis of both feet -no synovitis was noted.  Followed by Dr. Al Corpus.  She jogs and does aerobics.  Hair loss - She has been followed by Dr. Emily Filbert and had extensive workup.  She is also seeing [facility] dermatology and [facility] dermatology.  Other fatigue -she has been experiencing intermittent increased fatigue.  Plan: TSH, CK  Pruritus - On Atarax and gabapentin.  Other depression - She is on Prozac.  Adult attention deficit hyperactivity disorder  Somatization disorder  Osteoporosis screening -patient states she had a DEXA scan many years ago.  I will schedule DEXA scan.  Plan: Parathyroid hormone, intact (no Ca), Phosphorus, Serum protein electrophoresis with reflex  Postmenopausal  Vitamin D deficiency -she has history of vitamin D deficiency.  Will check vitamin D level today.  Plan: VITAMIN D 25 Hydroxy (Vit-D Deficiency, Fractures)  Orders: Orders Placed This Encounter  Procedures   DG Bone Density   TSH   CK   VITAMIN D 25 Hydroxy (Vit-D Deficiency, Fractures)   Parathyroid hormone, intact (no Ca)   Phosphorus   Serum protein electrophoresis with reflex   Rheumatoid factor   Cyclic citrul peptide antibody, IgG   No orders of the defined types were placed in this encounter.   Face-to-face time spent with patient was 45 minutes. Greater than 50% of time  was spent in counseling and coordination of care.  Follow-Up Instructions: Return for Osteoarthritis, Raynaud's, +ANA.   Pollyann Savoy, MD  Note - This record has been created using Animal nutritionist.  Chart creation errors have been sought, but may not always  have been located. Such creation errors do not reflect on  the standard of medical care.

## 2022-09-08 ENCOUNTER — Encounter: Payer: Self-pay | Admitting: Rheumatology

## 2022-09-08 ENCOUNTER — Ambulatory Visit: Payer: Commercial Managed Care - PPO | Attending: Rheumatology | Admitting: Rheumatology

## 2022-09-08 VITALS — BP 111/70 | HR 77 | Resp 14 | Ht 63.5 in | Wt 90.0 lb

## 2022-09-08 DIAGNOSIS — M79641 Pain in right hand: Secondary | ICD-10-CM

## 2022-09-08 DIAGNOSIS — I7300 Raynaud's syndrome without gangrene: Secondary | ICD-10-CM | POA: Diagnosis not present

## 2022-09-08 DIAGNOSIS — R768 Other specified abnormal immunological findings in serum: Secondary | ICD-10-CM

## 2022-09-08 DIAGNOSIS — Z780 Asymptomatic menopausal state: Secondary | ICD-10-CM

## 2022-09-08 DIAGNOSIS — M19071 Primary osteoarthritis, right ankle and foot: Secondary | ICD-10-CM

## 2022-09-08 DIAGNOSIS — L299 Pruritus, unspecified: Secondary | ICD-10-CM

## 2022-09-08 DIAGNOSIS — F450 Somatization disorder: Secondary | ICD-10-CM

## 2022-09-08 DIAGNOSIS — R5383 Other fatigue: Secondary | ICD-10-CM

## 2022-09-08 DIAGNOSIS — E559 Vitamin D deficiency, unspecified: Secondary | ICD-10-CM

## 2022-09-08 DIAGNOSIS — L659 Nonscarring hair loss, unspecified: Secondary | ICD-10-CM

## 2022-09-08 DIAGNOSIS — Z13820 Encounter for screening for osteoporosis: Secondary | ICD-10-CM

## 2022-09-08 DIAGNOSIS — M79642 Pain in left hand: Secondary | ICD-10-CM

## 2022-09-08 DIAGNOSIS — F3289 Other specified depressive episodes: Secondary | ICD-10-CM

## 2022-09-08 DIAGNOSIS — M19072 Primary osteoarthritis, left ankle and foot: Secondary | ICD-10-CM

## 2022-09-08 DIAGNOSIS — F909 Attention-deficit hyperactivity disorder, unspecified type: Secondary | ICD-10-CM

## 2022-09-15 LAB — CYCLIC CITRUL PEPTIDE ANTIBODY, IGG: Cyclic Citrullin Peptide Ab: 24 UNITS — ABNORMAL HIGH

## 2022-09-15 LAB — PROTEIN ELECTROPHORESIS, SERUM, WITH REFLEX: Total Protein: 6.2 g/dL (ref 6.1–8.1)

## 2022-09-16 ENCOUNTER — Encounter: Payer: Self-pay | Admitting: *Deleted

## 2022-09-16 NOTE — Progress Notes (Signed)
I discussed with the pharmacist.  Patient can take hydroxychloroquine while she is on current medications.  If patient is interested in starting hydroxychloroquine we can schedule an appointment.  There will be no other options which will have less side effects than hydroxychloroquine.  I will be happy to refer patient to a tertiary care center if she would prefer .

## 2022-09-16 NOTE — Progress Notes (Signed)
Rheumatoid factor negative, Anti-CCP is weak positive, CK normal, TSH normal, PTH normal phosphorus normal, vitamin D normal, IFE normal.  Patient may contact us once she is off other medications to start on hydroxychloroquine as discussed during the last visit.

## 2022-12-03 NOTE — Progress Notes (Deleted)
Office Visit Note  Patient: [name]             Date of Birth: 1960-05-02           MRN: [id number]             PCP: Valerie Dimitri, MD Referring: Valerie Dimitri, MD Visit Date: 12/16/2022 Occupation: @GUAROCC @  Subjective:  No chief complaint on file.   History of Present Illness: [name] is a 62 y.o. female ***     Activities of Daily Living:  Patient reports morning stiffness for *** {minute/hour:19697}.   Patient {ACTIONS;DENIES/REPORTS:21021675::"Denies"} nocturnal pain.  Difficulty dressing/grooming: {ACTIONS;DENIES/REPORTS:21021675::"Denies"} Difficulty climbing stairs: {ACTIONS;DENIES/REPORTS:21021675::"Denies"} Difficulty getting out of chair: {ACTIONS;DENIES/REPORTS:21021675::"Denies"} Difficulty using hands for taps, buttons, cutlery, and/or writing: {ACTIONS;DENIES/REPORTS:21021675::"Denies"}  No Rheumatology ROS completed.   PMFS History:  Patient Active Problem List   Diagnosis Date Noted   Plantar fasciitis 12/26/2014    Past Medical History:  Diagnosis Date   History of alopecia    dx by Dr. Emily Ali per patient   History of depression    History of hypothyroidism     Family History  Problem Relation Age of Onset   Raynaud syndrome Mother    Heart disease Father    Asthma Sister    Thyroid disease Sister    Past Surgical History:  Procedure Laterality Date   BREAST SURGERY Left    WISDOM TOOTH EXTRACTION     3 removed   Social History   Social History Narrative   Not on file    There is no immunization history on file for this patient.   Objective: Vital Signs: There were no vitals taken for this visit.   Physical Exam   Musculoskeletal Exam: ***  CDAI Exam: CDAI Score: -- Patient Global: --; Provider Global: -- Swollen: --; Tender: -- Joint Exam 12/16/2022   No joint exam has been documented for this visit   There is currently no information documented on the homunculus. Go to the Rheumatology activity and  complete the homunculus joint exam.  Investigation: No additional findings.  Imaging: No results found.  Recent Labs: Lab Results  Component Value Date   WBC 3.1 (L) 08/18/2022   HGB 13.2 08/18/2022   PLT 239 08/18/2022   NA 139 08/18/2022   K 4.5 08/18/2022   CL 103 08/18/2022   CO2 28 08/18/2022   GLUCOSE 110 (H) 08/18/2022   BUN 16 08/18/2022   CREATININE 0.72 08/18/2022   BILITOT 0.3 08/18/2022   ALKPHOS 57 02/20/2008   AST 28 08/18/2022   ALT 27 08/18/2022   PROT 6.2 09/08/2022   ALBUMIN 3.6 02/20/2008   CALCIUM 9.4 08/18/2022   GFRAA  02/20/2008    >60        The eGFR has been calculated using the MDRD equation. This calculation has not been validated in all clinical situations. eGFR's persistently <60 mL/min signify possible Chronic Kidney Disease.    Speciality Comments: No specialty comments available.  Procedures:  No procedures performed Allergies: Patient has no known allergies.   Assessment / Plan:     Visit Diagnoses: No diagnosis found.  Orders: No orders of the defined types were placed in this encounter.  No orders of the defined types were placed in this encounter.   Face-to-face time spent with patient was *** minutes. Greater than 50% of time was spent in counseling and coordination of care.  Follow-Up Instructions: No follow-ups on file.   Ellen Henri, CMA  Note -  This record has been created using [employer].  Chart creation errors have been sought, but may not always  have been located. Such creation errors do not reflect on  the standard of medical care.

## 2022-12-16 ENCOUNTER — Ambulatory Visit: Payer: 59 | Admitting: Rheumatology

## 2022-12-16 DIAGNOSIS — R5383 Other fatigue: Secondary | ICD-10-CM

## 2022-12-16 DIAGNOSIS — M79642 Pain in left hand: Secondary | ICD-10-CM

## 2022-12-16 DIAGNOSIS — F3289 Other specified depressive episodes: Secondary | ICD-10-CM

## 2022-12-16 DIAGNOSIS — I7300 Raynaud's syndrome without gangrene: Secondary | ICD-10-CM

## 2022-12-16 DIAGNOSIS — L299 Pruritus, unspecified: Secondary | ICD-10-CM

## 2022-12-16 DIAGNOSIS — Z780 Asymptomatic menopausal state: Secondary | ICD-10-CM

## 2022-12-16 DIAGNOSIS — F909 Attention-deficit hyperactivity disorder, unspecified type: Secondary | ICD-10-CM

## 2022-12-16 DIAGNOSIS — L659 Nonscarring hair loss, unspecified: Secondary | ICD-10-CM

## 2022-12-16 DIAGNOSIS — F450 Somatization disorder: Secondary | ICD-10-CM

## 2022-12-16 DIAGNOSIS — R768 Other specified abnormal immunological findings in serum: Secondary | ICD-10-CM

## 2022-12-16 DIAGNOSIS — Z13820 Encounter for screening for osteoporosis: Secondary | ICD-10-CM

## 2022-12-16 DIAGNOSIS — E559 Vitamin D deficiency, unspecified: Secondary | ICD-10-CM

## 2022-12-16 DIAGNOSIS — M19071 Primary osteoarthritis, right ankle and foot: Secondary | ICD-10-CM

## 2023-05-11 ENCOUNTER — Telehealth: Payer: Self-pay | Admitting: *Deleted

## 2023-05-11 NOTE — Telephone Encounter (Signed)
 Patient contacted the office and left message stating she saw Dr. Alvira Josephs is July and wanted to discuss some hand numbness she is experiencing.   Attempted to contact the patient. No answer and voicemail is full. Unable to leave a message.

## 2023-05-13 ENCOUNTER — Telehealth: Payer: Self-pay | Admitting: *Deleted

## 2023-05-13 NOTE — Telephone Encounter (Signed)
 Patient contacted the office and left message stating she was returning a call she missed. Patient also states she has a dermatology appointment tomorrow and would like her labs faxed to [facility].   Attempted to contact the patient and unable to leave a voicemail, mailbox is full.   Patient will need to schedule an appointment for evaluation. Patient was last seen August 2024 and no showed an appointment November 2024. Patient does not have any recent labs. Last labs were August 2024.

## 2023-05-19 ENCOUNTER — Other Ambulatory Visit (HOSPITAL_COMMUNITY)
Admission: RE | Admit: 2023-05-19 | Discharge: 2023-05-19 | Disposition: A | Discharge status: Home / Self Care | Source: Ambulatory Visit | Attending: Dermatology | Admitting: Dermatology

## 2023-05-19 ENCOUNTER — Other Ambulatory Visit (HOSPITAL_COMMUNITY): Payer: Self-pay

## 2023-05-19 DIAGNOSIS — Z79899 Other long term (current) drug therapy: Secondary | ICD-10-CM | POA: Insufficient documentation

## 2023-05-19 DIAGNOSIS — R208 Other disturbances of skin sensation: Secondary | ICD-10-CM | POA: Insufficient documentation

## 2023-05-19 LAB — TSH: TSH: 1.931 u[IU]/mL (ref 0.350–4.500)

## 2023-05-19 LAB — FERRITIN: Ferritin: 33 ng/mL (ref 11–307)

## 2023-05-19 LAB — VITAMIN D 25 HYDROXY (VIT D DEFICIENCY, FRACTURES): Vit D, 25-Hydroxy: 38.67 ng/mL (ref 30–100)

## 2023-05-19 MED ORDER — METHYLPHENIDATE HCL ER (LA) 30 MG PO CP24
30.0000 mg | ORAL_CAPSULE | Freq: Every morning | ORAL | 0 refills | Status: DC
Start: 2023-05-19 — End: 2023-06-16
  Filled 2023-05-19: qty 30, 30d supply, fill #0

## 2023-06-16 ENCOUNTER — Other Ambulatory Visit (HOSPITAL_COMMUNITY): Payer: Self-pay

## 2023-06-16 MED ORDER — METHYLPHENIDATE HCL ER (LA) 30 MG PO CP24
30.0000 mg | ORAL_CAPSULE | Freq: Every morning | ORAL | 0 refills | Status: DC
Start: 2023-06-16 — End: 2023-07-19
  Filled 2023-06-16: qty 30, 30d supply, fill #0

## 2023-06-18 ENCOUNTER — Other Ambulatory Visit (HOSPITAL_COMMUNITY): Payer: Self-pay

## 2023-07-16 ENCOUNTER — Other Ambulatory Visit (HOSPITAL_COMMUNITY): Payer: Self-pay | Admitting: Family Medicine

## 2023-07-16 DIAGNOSIS — Z8249 Family history of ischemic heart disease and other diseases of the circulatory system: Secondary | ICD-10-CM

## 2023-07-19 ENCOUNTER — Other Ambulatory Visit (HOSPITAL_COMMUNITY): Payer: Self-pay

## 2023-07-19 MED ORDER — METHYLPHENIDATE HCL ER (LA) 30 MG PO CP24
30.0000 mg | ORAL_CAPSULE | Freq: Every morning | ORAL | 0 refills | Status: DC
  Filled 2023-07-19: qty 30, 30d supply, fill #0

## 2023-07-24 ENCOUNTER — Other Ambulatory Visit (HOSPITAL_COMMUNITY): Payer: Self-pay

## 2023-07-26 ENCOUNTER — Other Ambulatory Visit (HOSPITAL_COMMUNITY): Payer: Self-pay

## 2023-08-20 ENCOUNTER — Other Ambulatory Visit (HOSPITAL_COMMUNITY): Payer: Self-pay

## 2023-08-20 MED ORDER — METHYLPHENIDATE HCL ER (LA) 30 MG PO CP24
30.0000 mg | ORAL_CAPSULE | Freq: Every morning | ORAL | 0 refills | Status: DC
  Filled 2023-08-20: qty 30, 30d supply, fill #0

## 2023-08-21 ENCOUNTER — Other Ambulatory Visit (HOSPITAL_COMMUNITY): Payer: Self-pay

## 2023-08-31 ENCOUNTER — Other Ambulatory Visit (HOSPITAL_COMMUNITY)

## 2023-09-03 ENCOUNTER — Ambulatory Visit: Admitting: Diagnostic Neuroimaging

## 2023-09-30 ENCOUNTER — Other Ambulatory Visit (HOSPITAL_COMMUNITY): Payer: Self-pay

## 2023-10-15 ENCOUNTER — Ambulatory Visit: Admitting: Neurology

## 2023-10-19 ENCOUNTER — Ambulatory Visit: Admitting: Neurology

## 2023-10-25 ENCOUNTER — Encounter: Payer: Self-pay | Admitting: Neurology

## 2023-10-25 ENCOUNTER — Ambulatory Visit (INDEPENDENT_AMBULATORY_CARE_PROVIDER_SITE_OTHER): Admitting: Neurology

## 2023-10-25 VITALS — BP 100/66 | HR 90 | Resp 15 | Ht 63.5 in | Wt 89.0 lb

## 2023-10-25 DIAGNOSIS — R202 Paresthesia of skin: Secondary | ICD-10-CM | POA: Insufficient documentation

## 2023-10-25 DIAGNOSIS — M542 Cervicalgia: Secondary | ICD-10-CM | POA: Insufficient documentation

## 2023-10-25 DIAGNOSIS — H538 Other visual disturbances: Secondary | ICD-10-CM | POA: Diagnosis not present

## 2023-10-25 MED ORDER — PREGABALIN 50 MG PO CAPS: 50.0000 mg | ORAL_CAPSULE | Freq: Three times a day (TID) | ORAL | 5 refills | Status: DC

## 2023-10-25 MED ORDER — ALPRAZOLAM 0.5 MG PO TABS: ORAL_TABLET | ORAL | 0 refills | Status: AC

## 2023-10-25 NOTE — Progress Notes (Signed)
 Chief Complaint  Patient presents with   New Patient (Initial Visit)    Rm15, alone,  referral for Scalp Dyesthesia / Valerie Reusing MD [facility] [phone]: pt stated that she has nerve pain in scalp that radiates to other ares. She stated that she has broken hair and new hair growth. Pt stated that she also has peripheral neuropathy with tingly hands and feet. Pt also mentioned neck pain that is worse with scalp pain. Pt also breaks out in hives/rash and thinks it may be related to scalp pain. Pt would like refill of hydroxyzine      ASSESSMENT AND PLAN  ANAGABRIELA Ali is a 63 y.o. female   Scalp sensitivity, Intermittent limb paresthesia, neck pain, Blurry vision  Constellation of complaints, normal muscle strength, brisk reflex on examination  MRI of the brain, cervical spine to rule out structural abnormality  Laboratory evaluations,  Trial of Lyrica 50 mg 3 times a day,  Follow-up plan depend on workup result,   DIAGNOSTIC DATA (LABS, IMAGING, TESTING) - I reviewed patient records, labs, notes, testing and imaging myself where available.   MEDICAL HISTORY:  [name] is a 63 year old female, seen in request by Dermatologist Dr. Reusing, Valerie, for evaluation of scalp sensitivity, a primary care is from [facility] Dr. Aisha Harvey, initial evaluation was on October 25, 2023  History is obtained from the patient and review of electronic medical records. I personally reviewed pertinent available imaging films in PACS.   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 Reusing on May 14, 2023, seborrheic keratosis,trichoscopy within normal  limit, no significant pili torti, tried ketoconazole shampoo, patient complains of dry hair, could not tolerated, also tried gabapentin up to 100 mg 3 times a day, but worried about the side effects she only took it once a day, without significant benefit,   She also has constellation of complaints of intermittent toes and hands paresthesia cold sensation, had a EMG and nerve conduction study previously confirmed carpal tunnel, she complains of intermittent blurry vision, balance issues  She used to be extremely physically active, swim, workout regularly, now become much sedentary, to suffer depression anxiety taking Prozac 40 mg daily, unsure about the benefit  PHYSICAL EXAM:   Vitals:   10/25/23 1000  BP: 100/66  Pulse: 90  Resp: 15  Weight: 89 lb (40.4 kg)  Height: 5' 3.5 (1.613 m)   Body mass index is 15.52 kg/m.  PHYSICAL EXAMNIATION:  Gen: NAD, conversant, well nourised, well groomed                     Cardiovascular: Regular rate rhythm, no peripheral edema, warm, nontender. Eyes: Conjunctivae clear without exudates or hemorrhage Neck: Supple, no carotid bruits. Pulmonary: Clear to auscultation bilaterally   NEUROLOGICAL EXAM:  MENTAL STATUS: Speech/cognition: Anxious looking middle-age female, awake, alert, oriented to history taking and casual conversation CRANIAL NERVES: CN II: Visual fields are full to confrontation. Pupils are round equal and briskly reactive to light. CN III, IV, VI: extraocular movement are normal. No ptosis. CN V: Facial sensation is intact to light touch CN VII: Face is symmetric with normal eye closure  CN VIII: Hearing is  normal to causal conversation. CN IX, X: Phonation is normal. CN XI: Head turning and shoulder shrug are intact  MOTOR: There is no pronator drift of out-stretched arms. Muscle bulk and tone are normal. Muscle strength is normal.  REFLEXES: Reflexes are 2+ and symmetric at the biceps, triceps, knees, and ankles.  Plantar responses are flexor.  SENSORY: Intact to light touch, pinprick and vibratory sensation are intact in fingers and toes.  COORDINATION: There is no trunk or limb dysmetria noted.  GAIT/STANCE: Posture is normal. Gait is steady with normal steps, base, arm swing, and turning. Heel and toe walking are normal. Tandem gait is normal.  Romberg is absent.  REVIEW OF SYSTEMS:  Full 14 system review of systems performed and notable only for as above All other review of systems were negative.   ALLERGIES: No Known Allergies  HOME MEDICATIONS: Current Outpatient Medications  Medication Sig Dispense Refill   finasteride (PROPECIA) 1 MG tablet Take 1 mg by mouth daily.     ketoconazole (NIZORAL) 2 % shampoo Apply topically.     Multiple Vitamins-Minerals (HAIR SKIN & NAILS) TABS 1 tablet Orally once a day     SUMAtriptan (IMITREX) 50 MG tablet Take 50 mg by mouth as needed.     terbinafine  (LAMISIL ) 250 MG tablet Take 1 tablet (250 mg total) by mouth daily. 90 tablet 0   traZODone (DESYREL) 50 MG tablet Take 25 mg by mouth at bedtime as needed.     triamcinolone cream (KENALOG) 0.1 % 1 application Externally once a day, mix with Amlactin for 30 days     VYVANSE 30 MG capsule Take 30 mg by mouth daily.     FLUoxetine (PROZAC) 40 MG capsule Take 40 mg by mouth daily. (Patient not taking: Reported on 10/25/2023)     hydrOXYzine (ATARAX) 25 MG tablet Take 25 mg by mouth daily. (Patient not taking: Reported on 09/08/2022)     No current facility-administered medications for this visit.    PAST MEDICAL HISTORY: Past Medical History:  Diagnosis Date   History of alopecia    dx by Dr. Robinson per patient   History of depression    History of hypothyroidism     PAST SURGICAL HISTORY: Past Surgical History:  Procedure Laterality Date   BREAST SURGERY Left    WISDOM TOOTH EXTRACTION     3 removed    FAMILY HISTORY: Family History  Problem Relation Age of Onset   Raynaud syndrome  Mother    Heart disease Father    Asthma Sister    Thyroid  disease Sister     SOCIAL HISTORY: Social History   Socioeconomic History   Marital status: Single    Spouse name: Not on file   Number of children: Not on file   Years of education: Not on file   Highest education level: Not on file  Occupational History   Not on file  Tobacco Use   Smoking status: Never    Passive exposure: Never   Smokeless tobacco: Never  Vaping Use   Vaping status: Never Used  Substance and Sexual Activity   Alcohol use: Yes    Comment: rarely   Drug use: Never   Sexual activity: Not on file  Other Topics Concern   Not on file  Social History Narrative   Not on file   Social Drivers of Health   Financial Resource Strain: Not on file  Food Insecurity: Not on file  Transportation Needs: Not on file  Physical  Activity: Not on file  Stress: Not on file  Social Connections: Not on file  Intimate Partner Violence: Not on file      Modena Callander, M.D. Ph.D.  [facility] [address], [address] [place], [place] [address] Ph: [phone] Fax: [phone]  CC:  Robinson Pao, MD [address], [address] [place],  [place]  Aisha Harvey, MD  +

## 2023-10-26 LAB — COMPREHENSIVE METABOLIC PANEL WITH GFR
ALT: 32 IU/L (ref 0–32)
AST: 31 IU/L (ref 0–40)
Albumin: 4.6 g/dL (ref 3.9–4.9)
Alkaline Phosphatase: 75 IU/L (ref 49–135)
BUN/Creatinine Ratio: 22 (ref 12–28)
BUN: 15 mg/dL (ref 8–27)
Bilirubin Total: 0.2 mg/dL (ref 0.0–1.2)
CO2: 28 mmol/L (ref 20–29)
Calcium: 9.3 mg/dL (ref 8.7–10.3)
Chloride: 96 mmol/L (ref 96–106)
Creatinine, Ser: 0.68 mg/dL (ref 0.57–1.00)
Globulin, Total: 1.8 g/dL (ref 1.5–4.5)
Glucose: 121 mg/dL — ABNORMAL HIGH (ref 70–99)
Potassium: 4 mmol/L (ref 3.5–5.2)
Sodium: 137 mmol/L (ref 134–144)
Total Protein: 6.4 g/dL (ref 6.0–8.5)
eGFR: 97 mL/min/1.73 (ref >59)

## 2023-10-26 LAB — ANA W/REFLEX IF POSITIVE
Anti JO-1: <0.2 AI (ref 0.0–0.9)
Anti Nuclear Antibody (ANA): POSITIVE — AB
Centromere Ab Screen: 1.1 AI — ABNORMAL HIGH (ref 0.0–0.9)
Chromatin Ab SerPl-aCnc: <0.2 AI (ref 0.0–0.9)
ENA RNP Ab: <0.2 AI (ref 0.0–0.9)
ENA SM Ab Ser-aCnc: <0.2 AI (ref 0.0–0.9)
ENA SSA (RO) Ab: <0.2 AI (ref 0.0–0.9)
ENA SSB (LA) Ab: <0.2 AI (ref 0.0–0.9)
Scleroderma (Scl-70) (ENA) Antibody, IgG: <0.2 AI (ref 0.0–0.9)
dsDNA Ab: <1 [IU]/mL (ref 0–9)

## 2023-10-26 LAB — CBC WITH DIFFERENTIAL/PLATELET
Basophils Absolute: 0 x10E3/uL (ref 0.0–0.2)
Basos: 1 %
EOS (ABSOLUTE): 0.1 x10E3/uL (ref 0.0–0.4)
Eos: 2 %
Hematocrit: 43.2 % (ref 34.0–46.6)
Hemoglobin: 14 g/dL (ref 11.1–15.9)
Immature Grans (Abs): 0 x10E3/uL (ref 0.0–0.1)
Immature Granulocytes: 0 %
Lymphocytes Absolute: 1 x10E3/uL (ref 0.7–3.1)
Lymphs: 30 %
MCH: 30.8 pg (ref 26.6–33.0)
MCHC: 32.4 g/dL (ref 31.5–35.7)
MCV: 95 fL (ref 79–97)
Monocytes Absolute: 0.3 x10E3/uL (ref 0.1–0.9)
Monocytes: 10 %
Neutrophils Absolute: 1.8 x10E3/uL (ref 1.4–7.0)
Neutrophils: 57 %
Platelets: 230 x10E3/uL (ref 150–450)
RBC: 4.54 x10E6/uL (ref 3.77–5.28)
RDW: 11.9 % (ref 11.7–15.4)
WBC: 3.2 x10E3/uL — ABNORMAL LOW (ref 3.4–10.8)

## 2023-10-26 LAB — THYROID PANEL WITH TSH
Free Thyroxine Index: 1.7 (ref 1.2–4.9)
T3 Uptake Ratio: 30 % (ref 24–39)
T4, Total: 5.8 ug/dL (ref 4.5–12.0)
TSH: 1.87 u[IU]/mL (ref 0.450–4.500)

## 2023-10-26 LAB — HGB A1C W/O EAG: Hgb A1c MFr Bld: 5.8 % — ABNORMAL HIGH (ref 4.8–5.6)

## 2023-10-26 LAB — CK: Total CK: 139 U/L (ref 32–182)

## 2023-10-27 ENCOUNTER — Ambulatory Visit: Payer: Self-pay | Admitting: Neurology

## 2023-11-01 ENCOUNTER — Telehealth: Payer: Self-pay | Admitting: Neurology

## 2023-11-01 ENCOUNTER — Encounter: Payer: Self-pay | Admitting: Neurology

## 2023-11-01 NOTE — Telephone Encounter (Signed)
 She left a voice mail on my phone asking for a call back about her lab results, she said there isn't a full report available on her chart.

## 2023-11-01 NOTE — Telephone Encounter (Signed)
 I will change the MRI orders and call her back, but before I saw this message she had called me again and had all kinds of questions about things she has read on the internet about her symptoms and said she only had a 15 minute visit with Dr. Onita and most of it was them going over her medications. I told her to send a mychart message with her concerns and the doctor covering Dr. Onita can get back to her.

## 2023-11-01 NOTE — Telephone Encounter (Signed)
 I called her to schedule the MRIs that Dr. Onita ordered and she had a lot of questions about more tests that can possibly be ordered for her to figure out what is causing her nerve damage. She said she read about a MRN (like a MRI but for nerves) and I said that is not something we order but I will ask for her. She also asked if there is a MRI that can show what is causing the nerve damage in the roots of her hair that is causing her to have a itchy scalp and her hair break off? She said she is always itchy and twitching. She also also asked if the MRIs can be done with contrast as well because she read that images are more clear with it. Please advise, thanks! I said I would call her back about scheduling the MRIs when I hear back about the contrast.

## 2023-11-02 NOTE — Telephone Encounter (Signed)
 I left her a voice mail letting her know the MRI orders were changed to w/wo and to call me back to schedule them. I also let her know that April responded to her mychart message about her labs.

## 2023-11-08 ENCOUNTER — Telehealth: Payer: Self-pay | Admitting: Neurology

## 2023-11-08 NOTE — Telephone Encounter (Signed)
 No auth required sent to [facility] for open MRI. [phone]

## 2023-11-08 NOTE — Telephone Encounter (Signed)
 Patient calling to schedule MRI. Would like schedule somewhere can get an open MRI. Asking for a call back.

## 2023-11-12 ENCOUNTER — Telehealth: Payer: Self-pay | Admitting: Diagnostic Neuroimaging

## 2023-11-12 MED ORDER — GABAPENTIN 100 MG PO CAPS: 100.0000 mg | ORAL_CAPSULE | Freq: Three times a day (TID) | ORAL | 1 refills | Status: DC

## 2023-11-12 NOTE — Telephone Encounter (Signed)
 Patient called in with nerve pain on scalp. Lyrica not working. Wants to try gabapentin 100mg  three times a day. Will start 100mg  daily first.   Meds ordered this encounter  Medications   gabapentin (NEURONTIN) 100 MG capsule    Sig: Take 1 capsule (100 mg total) by mouth 3 (three) times daily.    Dispense:  90 capsule    Refill:  1   EDUARD FABIENE HANLON, MD 11/12/2023, 4:54 PM Certified in Neurology, Neurophysiology and Neuroimaging  [facility] [address], [address] [place], [place] [address] [phone]

## 2023-12-15 ENCOUNTER — Ambulatory Visit (INDEPENDENT_AMBULATORY_CARE_PROVIDER_SITE_OTHER)

## 2023-12-15 DIAGNOSIS — M542 Cervicalgia: Secondary | ICD-10-CM | POA: Diagnosis not present

## 2023-12-15 DIAGNOSIS — H538 Other visual disturbances: Secondary | ICD-10-CM | POA: Diagnosis not present

## 2023-12-15 DIAGNOSIS — R202 Paresthesia of skin: Secondary | ICD-10-CM

## 2023-12-15 MED ORDER — GADOBENATE DIMEGLUMINE 529 MG/ML IV SOLN
8.0000 mL | Freq: Once | INTRAVENOUS | Status: AC | PRN
  Administered 2023-12-15: 8 mL via INTRAVENOUS

## 2024-01-11 ENCOUNTER — Other Ambulatory Visit: Payer: Self-pay | Admitting: Diagnostic Neuroimaging

## 2024-02-02 ENCOUNTER — Telehealth: Payer: Self-pay | Admitting: Neurology

## 2024-02-02 NOTE — Telephone Encounter (Signed)
 Pt called to follow up  with  MD . Pt stated she had her MRI and wanted to know  after result  what will be her next steps  as far as treatment

## 2024-02-03 NOTE — Telephone Encounter (Signed)
 I called and LMVM for pt that returning her call.  Pt had seen her MRI results.  Dr. Onita offered PT.  I see that lyrica  did not help and Dr. Margaret had sent gabapentin  100mg  po tid.  (did that help?).

## 2024-02-15 NOTE — Progress Notes (Unsigned)
 "  Office Visit Note  Patient: [name]             Date of Birth: 31-Oct-1960           MRN: [id number]             PCP: Aisha Harvey, MD Referring: Aisha Harvey, MD Visit Date: 02/29/2024 Occupation: Data Unavailable  Subjective:  No chief complaint on file.   History of Present Illness: [name] is a 64 y.o. female ***     Activities of Daily Living:  Patient reports morning stiffness for *** {minute/hour:19697}.   Patient {ACTIONS;DENIES/REPORTS:21021675::Denies} nocturnal pain.  Difficulty dressing/grooming: {ACTIONS;DENIES/REPORTS:21021675::Denies} Difficulty climbing stairs: {ACTIONS;DENIES/REPORTS:21021675::Denies} Difficulty getting out of chair: {ACTIONS;DENIES/REPORTS:21021675::Denies} Difficulty using hands for taps, buttons, cutlery, and/or writing: {ACTIONS;DENIES/REPORTS:21021675::Denies}  No Rheumatology ROS completed.   PMFS History:  Patient Active Problem List   Diagnosis Date Noted   Paresthesia 10/25/2023   Neck pain 10/25/2023   Blurry vision 10/25/2023   Plantar fasciitis 12/26/2014    Past Medical History:  Diagnosis Date   History of alopecia    dx by Dr. Robinson per patient   History of depression    History of hypothyroidism     Family History  Problem Relation Age of Onset   Raynaud syndrome Mother    Heart disease Father    Asthma Sister    Thyroid  disease Sister    Past Surgical History:  Procedure Laterality Date   BREAST SURGERY Left    WISDOM TOOTH EXTRACTION     3 removed   Social History[1] Social History   Social History Narrative   Not on file      There is no immunization history on file for this patient.   Objective: Vital Signs: There were no vitals taken for this visit.   Physical Exam   Musculoskeletal Exam: ***  CDAI Exam: CDAI Score: -- Patient Global: --; Provider Global: -- Swollen: --; Tender: -- Joint Exam 02/29/2024   No joint exam has been documented for this visit    There is currently no information documented on the homunculus. Go to the Rheumatology activity and complete the homunculus joint exam.  Investigation: No additional findings.  Imaging: No results found.  Recent Labs: Lab Results  Component Value Date   WBC 3.2 (L) 10/25/2023   HGB 14.0 10/25/2023   PLT 230 10/25/2023   NA 137 10/25/2023   K 4.0 10/25/2023   CL 96 10/25/2023   CO2 28 10/25/2023   GLUCOSE 121 (H) 10/25/2023   BUN 15 10/25/2023   CREATININE 0.68 10/25/2023   BILITOT 0.2 10/25/2023   ALKPHOS 75 10/25/2023   AST 31 10/25/2023   ALT 32 10/25/2023   PROT 6.4 10/25/2023   ALBUMIN 4.6 10/25/2023   CALCIUM 9.3 10/25/2023   GFRAA  02/20/2008    >60        The eGFR has been calculated using the MDRD equation. This calculation has not been validated in all clinical situations. eGFR's persistently <60 mL/min signify possible Chronic Kidney Disease.    Speciality Comments: No specialty comments available.  Procedures:  No procedures performed Allergies: Patient has no known allergies.   Assessment / Plan:     Visit Diagnoses: No diagnosis found.  Orders: No orders of the defined types were placed in this encounter.  No orders of the defined types were placed in this encounter.   Face-to-face time spent with patient was *** minutes. Greater than 50% of time was spent in  counseling and coordination of care.  Follow-Up Instructions: No follow-ups on file.   Daved JAYSON Gavel, CMA  Note - This record has been created using [employer].  Chart creation errors have been sought, but may not always  have been located. Such creation errors do not reflect on  the standard of medical care.    [1]  Social History Tobacco Use   Smoking status: Never    Passive exposure: Never   Smokeless tobacco: Never  Vaping Use   Vaping status: Never Used  Substance Use Topics   Alcohol use: Yes    Comment: rarely   Drug use: Never   "

## 2024-02-29 ENCOUNTER — Ambulatory Visit: Admitting: Rheumatology

## 2024-02-29 DIAGNOSIS — Z780 Asymptomatic menopausal state: Secondary | ICD-10-CM

## 2024-02-29 DIAGNOSIS — R5383 Other fatigue: Secondary | ICD-10-CM

## 2024-02-29 DIAGNOSIS — F450 Somatization disorder: Secondary | ICD-10-CM

## 2024-02-29 DIAGNOSIS — F909 Attention-deficit hyperactivity disorder, unspecified type: Secondary | ICD-10-CM

## 2024-02-29 DIAGNOSIS — Z13820 Encounter for screening for osteoporosis: Secondary | ICD-10-CM

## 2024-02-29 DIAGNOSIS — R7689 Other specified abnormal immunological findings in serum: Secondary | ICD-10-CM

## 2024-02-29 DIAGNOSIS — E559 Vitamin D deficiency, unspecified: Secondary | ICD-10-CM

## 2024-02-29 DIAGNOSIS — L659 Nonscarring hair loss, unspecified: Secondary | ICD-10-CM

## 2024-02-29 DIAGNOSIS — M79641 Pain in right hand: Secondary | ICD-10-CM

## 2024-02-29 DIAGNOSIS — F3289 Other specified depressive episodes: Secondary | ICD-10-CM

## 2024-02-29 DIAGNOSIS — L299 Pruritus, unspecified: Secondary | ICD-10-CM

## 2024-02-29 DIAGNOSIS — M19071 Primary osteoarthritis, right ankle and foot: Secondary | ICD-10-CM

## 2024-02-29 DIAGNOSIS — I7300 Raynaud's syndrome without gangrene: Secondary | ICD-10-CM

## 2024-03-01 NOTE — Progress Notes (Unsigned)
 "  Office Visit Note  Patient: [name]             Date of Birth: September 08, 1960           MRN: [id number]             PCP: Aisha Harvey, MD Referring: Aisha Harvey, MD Visit Date: 03/06/2024 Occupation: Data Unavailable  Subjective:  No chief complaint on file.   History of Present Illness: [name] is a 64 y.o. female ***     Activities of Daily Living:  Patient reports morning stiffness for *** {minute/hour:19697}.   Patient {ACTIONS;DENIES/REPORTS:21021675::Denies} nocturnal pain.  Difficulty dressing/grooming: {ACTIONS;DENIES/REPORTS:21021675::Denies} Difficulty climbing stairs: {ACTIONS;DENIES/REPORTS:21021675::Denies} Difficulty getting out of chair: {ACTIONS;DENIES/REPORTS:21021675::Denies} Difficulty using hands for taps, buttons, cutlery, and/or writing: {ACTIONS;DENIES/REPORTS:21021675::Denies}  No Rheumatology ROS completed.   PMFS History:  Patient Active Problem List   Diagnosis Date Noted   Paresthesia 10/25/2023   Neck pain 10/25/2023   Blurry vision 10/25/2023   Plantar fasciitis 12/26/2014    Past Medical History:  Diagnosis Date   History of alopecia    dx by Dr. Robinson per patient   History of depression    History of hypothyroidism     Family History  Problem Relation Age of Onset   Raynaud syndrome Mother    Heart disease Father    Asthma Sister    Thyroid  disease Sister    Past Surgical History:  Procedure Laterality Date   BREAST SURGERY Left    WISDOM TOOTH EXTRACTION     3 removed   Social History[1] Social History   Social History Narrative   Not on file      There is no immunization history on file for this patient.   Objective: Vital Signs: There were no vitals taken for this visit.   Physical Exam   Musculoskeletal Exam: ***  CDAI Exam: CDAI Score: -- Patient Global: --; Provider Global: -- Swollen: --; Tender: -- Joint Exam 03/06/2024   No joint exam has been documented for this visit    There is currently no information documented on the homunculus. Go to the Rheumatology activity and complete the homunculus joint exam.  Investigation: No additional findings.  Imaging: No results found.  Recent Labs: Lab Results  Component Value Date   WBC 3.2 (L) 10/25/2023   HGB 14.0 10/25/2023   PLT 230 10/25/2023   NA 137 10/25/2023   K 4.0 10/25/2023   CL 96 10/25/2023   CO2 28 10/25/2023   GLUCOSE 121 (H) 10/25/2023   BUN 15 10/25/2023   CREATININE 0.68 10/25/2023   BILITOT 0.2 10/25/2023   ALKPHOS 75 10/25/2023   AST 31 10/25/2023   ALT 32 10/25/2023   PROT 6.4 10/25/2023   ALBUMIN 4.6 10/25/2023   CALCIUM 9.3 10/25/2023   GFRAA  02/20/2008    >60        The eGFR has been calculated using the MDRD equation. This calculation has not been validated in all clinical situations. eGFR's persistently <60 mL/min signify possible Chronic Kidney Disease.    Speciality Comments: No specialty comments available.  Procedures:  No procedures performed Allergies: Patient has no known allergies.   Assessment / Plan:     Visit Diagnoses: No diagnosis found.  Orders: No orders of the defined types were placed in this encounter.  No orders of the defined types were placed in this encounter.   Face-to-face time spent with patient was *** minutes. Greater than 50% of time was spent in  counseling and coordination of care.  Follow-Up Instructions: No follow-ups on file.   Daved JAYSON Gavel, CMA  Note - This record has been created using [employer].  Chart creation errors have been sought, but may not always  have been located. Such creation errors do not reflect on  the standard of medical care.     [1]  Social History Tobacco Use   Smoking status: Never    Passive exposure: Never   Smokeless tobacco: Never  Vaping Use   Vaping status: Never Used  Substance Use Topics   Alcohol use: Yes    Comment: rarely   Drug use: Never   "

## 2024-03-06 ENCOUNTER — Ambulatory Visit: Admitting: Rheumatology

## 2024-03-06 DIAGNOSIS — F909 Attention-deficit hyperactivity disorder, unspecified type: Secondary | ICD-10-CM

## 2024-03-06 DIAGNOSIS — M19072 Primary osteoarthritis, left ankle and foot: Secondary | ICD-10-CM

## 2024-03-06 DIAGNOSIS — L299 Pruritus, unspecified: Secondary | ICD-10-CM

## 2024-03-06 DIAGNOSIS — F3289 Other specified depressive episodes: Secondary | ICD-10-CM

## 2024-03-06 DIAGNOSIS — Z13820 Encounter for screening for osteoporosis: Secondary | ICD-10-CM

## 2024-03-06 DIAGNOSIS — Z780 Asymptomatic menopausal state: Secondary | ICD-10-CM

## 2024-03-06 DIAGNOSIS — I7300 Raynaud's syndrome without gangrene: Secondary | ICD-10-CM

## 2024-03-06 DIAGNOSIS — R7689 Other specified abnormal immunological findings in serum: Secondary | ICD-10-CM

## 2024-03-06 DIAGNOSIS — M79641 Pain in right hand: Secondary | ICD-10-CM

## 2024-03-06 DIAGNOSIS — L659 Nonscarring hair loss, unspecified: Secondary | ICD-10-CM

## 2024-03-06 DIAGNOSIS — R5383 Other fatigue: Secondary | ICD-10-CM

## 2024-03-06 DIAGNOSIS — F450 Somatization disorder: Secondary | ICD-10-CM

## 2024-03-06 DIAGNOSIS — E559 Vitamin D deficiency, unspecified: Secondary | ICD-10-CM

## 2024-03-15 ENCOUNTER — Ambulatory Visit: Admitting: Rheumatology
